# Patient Record
Sex: Female | Born: 1980
Health system: Southern US, Community
[De-identification: ages and names within clinical notes are randomized; demographics above are authoritative.]

## PROBLEM LIST (undated history)

## (undated) DIAGNOSIS — F329 Major depressive disorder, single episode, unspecified: Secondary | ICD-10-CM

## (undated) DIAGNOSIS — N979 Female infertility, unspecified: Secondary | ICD-10-CM

## (undated) DIAGNOSIS — F32A Depression, unspecified: Secondary | ICD-10-CM

## (undated) DIAGNOSIS — Z8619 Personal history of other infectious and parasitic diseases: Secondary | ICD-10-CM

## (undated) DIAGNOSIS — B009 Herpesviral infection, unspecified: Secondary | ICD-10-CM

## (undated) DIAGNOSIS — K219 Gastro-esophageal reflux disease without esophagitis: Secondary | ICD-10-CM

## (undated) DIAGNOSIS — IMO0001 Reserved for inherently not codable concepts without codable children: Secondary | ICD-10-CM

## (undated) HISTORY — DX: Female infertility, unspecified: N97.9

## (undated) HISTORY — DX: Major depressive disorder, single episode, unspecified: F32.9

## (undated) HISTORY — DX: Personal history of other infectious and parasitic diseases: Z86.19

## (undated) HISTORY — DX: Gastro-esophageal reflux disease without esophagitis: K21.9

## (undated) HISTORY — DX: Herpesviral infection, unspecified: B00.9

## (undated) HISTORY — DX: Depression, unspecified: F32.A

## (undated) HISTORY — DX: Reserved for inherently not codable concepts without codable children: IMO0001

---

## 2005-08-15 HISTORY — PX: CHOLECYSTECTOMY: SHX55

## 2010-05-21 ENCOUNTER — Ambulatory Visit: Payer: Self-pay | Admitting: Physician Assistant

## 2010-05-22 ENCOUNTER — Encounter: Payer: Self-pay | Admitting: Physician Assistant

## 2010-05-22 LAB — CONVERTED CEMR LAB
Clue Cells Wet Prep HPF POC: NONE SEEN
Trich, Wet Prep: NONE SEEN
Yeast Wet Prep HPF POC: NONE SEEN

## 2010-05-24 ENCOUNTER — Ambulatory Visit: Payer: Self-pay | Admitting: Obstetrics & Gynecology

## 2010-07-05 ENCOUNTER — Encounter: Admission: RE | Admit: 2010-07-05 | Discharge: 2010-07-05 | Payer: Self-pay | Admitting: Obstetrics & Gynecology

## 2010-07-07 ENCOUNTER — Ambulatory Visit: Payer: Self-pay | Admitting: Obstetrics & Gynecology

## 2010-08-15 HISTORY — PX: LAPAROSCOPY: SHX197

## 2010-08-18 ENCOUNTER — Encounter
Admission: RE | Admit: 2010-08-18 | Discharge: 2010-08-18 | Payer: Self-pay | Source: Home / Self Care | Attending: Obstetrics & Gynecology | Admitting: Obstetrics & Gynecology

## 2010-08-18 ENCOUNTER — Encounter: Payer: Self-pay | Admitting: Obstetrics & Gynecology

## 2010-08-18 ENCOUNTER — Ambulatory Visit
Admission: RE | Admit: 2010-08-18 | Discharge: 2010-08-18 | Payer: Self-pay | Source: Home / Self Care | Attending: Obstetrics & Gynecology | Admitting: Obstetrics & Gynecology

## 2010-08-18 LAB — CONVERTED CEMR LAB
Clue Cells Wet Prep HPF POC: NONE SEEN
Trich, Wet Prep: NONE SEEN
Yeast Wet Prep HPF POC: NONE SEEN

## 2010-08-21 ENCOUNTER — Encounter
Admission: RE | Admit: 2010-08-21 | Discharge: 2010-08-21 | Payer: Self-pay | Source: Home / Self Care | Attending: Obstetrics & Gynecology | Admitting: Obstetrics & Gynecology

## 2010-08-24 ENCOUNTER — Ambulatory Visit
Admission: RE | Admit: 2010-08-24 | Discharge: 2010-08-24 | Payer: Self-pay | Source: Home / Self Care | Attending: Obstetrics & Gynecology | Admitting: Obstetrics & Gynecology

## 2010-08-30 LAB — CBC
HCT: 39.5 % (ref 36.0–46.0)
Hemoglobin: 13.5 g/dL (ref 12.0–15.0)
MCH: 31.5 pg (ref 26.0–34.0)
MCHC: 34.2 g/dL (ref 30.0–36.0)
MCV: 92.3 fL (ref 78.0–100.0)
Platelets: 240 10*3/uL (ref 150–400)
RBC: 4.28 MIL/uL (ref 3.87–5.11)
RDW: 12.1 % (ref 11.5–15.5)
WBC: 6.7 10*3/uL (ref 4.0–10.5)

## 2010-08-30 LAB — SURGICAL PCR SCREEN
MRSA, PCR: NEGATIVE
Staphylococcus aureus: POSITIVE — AB

## 2010-09-06 ENCOUNTER — Ambulatory Visit (HOSPITAL_COMMUNITY)
Admission: RE | Admit: 2010-09-06 | Discharge: 2010-09-06 | Payer: Self-pay | Source: Home / Self Care | Attending: Obstetrics & Gynecology | Admitting: Obstetrics & Gynecology

## 2010-09-07 LAB — PREGNANCY, URINE: Preg Test, Ur: NEGATIVE

## 2010-09-08 ENCOUNTER — Ambulatory Visit: Admit: 2010-09-08 | Payer: Self-pay | Admitting: Obstetrics & Gynecology

## 2010-09-09 NOTE — Op Note (Addendum)
Maria [name], POLAND           ACCOUNT NO.:  [id number]  MEDICAL RECORD NO.:  [id number]          PATIENT TYPE:  AMB  LOCATION:  SDC                           FACILITY:  [facility]  PHYSICIAN:  Allie Bossier, MD        DATE OF BIRTH:  1981/03/14  DATE OF PROCEDURE:  09/06/2010 DATE OF DISCHARGE:  09/06/2010                              OPERATIVE REPORT   PREOPERATIVE DIAGNOSIS:  Right ovarian cyst.  POSTOPERATIVE DIAGNOSES:  Right ovarian cyst, endometriosis.  SURGEON:  Allie Bossier, MD  ASSISTANT:  Lesly Dukes, MD  ANESTHESIA:  Quillian Quince, MD, GETA.  COMPLICATIONS:  None.  ESTIMATED BLOOD LOSS:  Minimal.  SPECIMEN:  Excision of endometriosis implant from the cul-de-sac and right ovarian cyst.  DETAILED PROCEDURE AND FINDINGS:  The risks, benefits and alternatives of surgery were explained, understood and accepted.  Consents were signed.  She was taken to the operating room and general anesthesia was applied without complication.  In the dorsal lithotomy position, her vagina and abdomen were prepped and draped in the usual sterile fashion. A bimanual exam had been done which showed mobile adnexa bilaterally and an enlarged adnexa on her right.  Hulka manipulator was placed.  Gloves were changed.  Attention was turned to the abdomen.  A vertical umbilical incision was made.  A Veress needle was placed intraperitoneally.  I was unable to create a pneumoperitoneum due to abdominal pressure being too elevated; therefore, I discontinued the varies and decided to do an open laparoscopy.  The fascia was opened with Mayo scissors and tagged and held with 0 Vicryl sutures on each side.  A Hasson 10-mm trocar was placed.  Laparoscopy confirmed correct placement.  Pneumoperitoneum was established.  Peritoneum was established. Pelvis was inspected.  A 5-mm port was placed in the lower left quadrant and a 10-mm port in the lower right quadrant under direct laparoscopic  visualization, taking care to avoid the inferior epigastric vessel.  Inspection of the pelvis showed a normal upper abdomen.  The right ovary had multilobulated cyst.  There did not appear to be a hint of a endometrioma.  The posterior cul-de-sac showed some small amount of free fluid and an endometriosis lesion in the left side of the posterior cul-de-sac.  The left adnexa appeared normal.  Both oviducts appeared normal.  The anterior cul-de-sac appeared normal as did the uterus. Initially, we used a needle and hydrodissection to hydrodissect the ovarian capsule from the underlying ovarian cyst and an incision was made with cautery scissors and a opening was made large enough to remove the ovarian cyst.  By pulling the edges of the cut uterine capsule, the cyst was exposed and the large cyst was removed.  There was a several small grip size cyst that were not amendable to this treatment on the tail end of the ovary and using a gyrus, we removed this portion of the ovary.  Excellent hemostasis was noted.  The remainder of the normal- appearing ovary was wrapped with Interceed to help prevent future adhesions.  At this point, we elevated the endometriosis lesion in the cul-de-sac and excised it.  No bleeding was noted from the site.  The ports were removed.  The specimen was removed with the Endopouch and the fascia at both 10-mm sites were closed with 0 Vicryl figure-of-eight sutures.  A total of 10 mL of 0.5% Marcaine was then divided among the 3 incisions for postop pain relief.  Subcuticular closure was done at all sites with 4-0 Vicryl.  Instruments, sponge and needle counts were correct.  Please note that preoperatively, her bladder was drained with a straight cath.  She tolerated the procedure well.  She was extubated and taken to recovery room in stable condition with instruments, sponge and needle counts correct.     Allie Bossier, MD     MCD/MEDQ  D:  09/06/2010  T:   09/07/2010  Job:  [id number]  Electronically Signed by Nicholaus Bloom MD on 09/09/2010 03:50:09 PM

## 2010-09-15 ENCOUNTER — Encounter: Payer: Self-pay | Admitting: Obstetrics & Gynecology

## 2010-09-15 ENCOUNTER — Ambulatory Visit: Payer: BC Managed Care – PPO | Admitting: Obstetrics & Gynecology

## 2010-09-15 DIAGNOSIS — Z09 Encounter for follow-up examination after completed treatment for conditions other than malignant neoplasm: Secondary | ICD-10-CM

## 2010-10-19 ENCOUNTER — Encounter: Payer: BC Managed Care – PPO | Admitting: Obstetrics & Gynecology

## 2010-10-19 DIAGNOSIS — Z09 Encounter for follow-up examination after completed treatment for conditions other than malignant neoplasm: Secondary | ICD-10-CM

## 2010-11-05 ENCOUNTER — Ambulatory Visit: Payer: BC Managed Care – PPO

## 2010-11-05 NOTE — Assessment & Plan Note (Signed)
Maria [name]           ACCOUNT NO.:  [id number]  MEDICAL RECORD NO.:  [id number]           PATIENT TYPE:  LOCATION:  [facility] [place]           FACILITY:  PHYSICIAN:  Allie Bossier, MD        DATE OF BIRTH:  06-25-1981  DATE OF SERVICE:  09/15/2010                                 CLINIC NOTE  Ms. Maria [name] is a 30 year old single white G0, who had a laparoscopic right ovarian cystectomy (pathology shows benign ovarian serous cyst) and a biopsy of her cul-de-sac that showed endometriosis implant.  She had this done on September 06, 2010.  She is a week out.  She says that she went to work on Monday, but had to leave due to right-sided pain. She sits at her job and she says the pain is much worse when she is sitting at 90 degrees.  She also says that when she is standing and lying down, the pain is not as bad.  She still has Vicodin left.  She is taking about 1 Motrin a day.  Her other complaint is that of constipation.  She says she is having bowel movements but "not a weeks' worth."  She has been using MiraLax 17 g at night for the last 3 days and she tried 1 Correctol last night.  PHYSICAL EXAMINATION:  Her incisions are all healing very well. Bimanual exam reveals no masses or particular tenderness on exam of any of her sites.  ASSESSMENT AND PLAN:  I believe her postop discomfort is due to inadequate ibuprofen use as well as constipation.  I have recommended she take her ibuprofen 3 times a day whether she feels like she necessarily needs or not.  I have also recommended she double her dose and take MiraLax morning and evening and take 2 Correctol tonight.  I will see her in 4 weeks or sooner as necessary.     Allie Bossier, MD    MCD/MEDQ  D:  09/15/2010  T:  09/16/2010  Job:  [id number]

## 2010-11-08 ENCOUNTER — Ambulatory Visit: Payer: BC Managed Care – PPO

## 2010-11-09 ENCOUNTER — Ambulatory Visit (INDEPENDENT_AMBULATORY_CARE_PROVIDER_SITE_OTHER): Payer: BC Managed Care – PPO | Admitting: Obstetrics & Gynecology

## 2010-11-09 DIAGNOSIS — Z113 Encounter for screening for infections with a predominantly sexual mode of transmission: Secondary | ICD-10-CM

## 2010-11-09 DIAGNOSIS — N898 Other specified noninflammatory disorders of vagina: Secondary | ICD-10-CM

## 2010-11-10 ENCOUNTER — Encounter: Payer: Self-pay | Admitting: Obstetrics & Gynecology

## 2010-11-10 LAB — CONVERTED CEMR LAB
Chlamydia, DNA Probe: NEGATIVE
Clue Cells Wet Prep HPF POC: NONE SEEN
GC Probe Amp, Genital: NEGATIVE
HCV Ab: NEGATIVE
HIV: NONREACTIVE
Hepatitis B Surface Ag: NEGATIVE
Trich, Wet Prep: NONE SEEN
Yeast Wet Prep HPF POC: NONE SEEN

## 2010-11-17 ENCOUNTER — Ambulatory Visit: Payer: Self-pay | Admitting: Obstetrics & Gynecology

## 2010-12-28 NOTE — Assessment & Plan Note (Signed)
NAMERUBERTA, HOLCK           ACCOUNT NO.:  [id number]   MEDICAL RECORD NO.:  [id number]          PATIENT TYPE:  POB   LOCATION:  [facility] [place]         FACILITY:  [facility]   PHYSICIAN:  Elsie Lincoln, MD      DATE OF BIRTH:  May 16, 1981   DATE OF SERVICE:  07/07/2010                                  CLINIC NOTE   HISTORY:  The patient is a 30 year old female who presents for IUD  removal, to discuss ultrasound results, and start on OCPs.  The patient  is a para 1-0-1-0, who has Mirena after the past 4 years, but has right  lower quadrant pain.  She had a transvaginal ultrasound which revealed  multiple follicles abiding each either.  We did inform ultrasound here  and it appears that we had same results.  Sometime the Mirena can cause  persistent follicular cyst to occur.  I suggested that she had the IUD  removed and start on OCPs, this would also suppress ovulation and may  help this development of ovarian cyst.  The patient does agrees to this.  The patient knows that she needs to use backup contraception for the  first week after starting OCPs.   PHYSICAL EXAMINATION:  VITAL SIGNS:  Pulse 79, blood pressure 131/79,  weight 134, and height 62 inches.  GENERAL:  Well nourished, well developed, no apparent stress.  ABDOMEN:  Soft, nontender.  No rebound.  CHEST:  Normal movement.  GENITALIA:  Tanner V, shaved.  Vagina is pink and normal rugae.  Cervix  is closed, strings seen.   PROCEDURE:  Informed consent was obtained, intrauterine device was  removed without incident, and no bleeding.   ASSESSMENT AND PLAN:  A 30 year old female with persistent follicular  cyst on the right ovary.  1. Intrauterine device removal.  2. Start of oral contraceptive, Loestrin Fe 24.  3. Follow up transvaginal ultrasound in 8 weeks.  4. Return to clinic in 9 weeks.           ______________________________  Elsie Lincoln, MD     KL/MEDQ  D:  07/07/2010  T:  07/07/2010  Job:   [id number]

## 2010-12-28 NOTE — Assessment & Plan Note (Signed)
NAMEYARELLI, DECELLES           ACCOUNT NO.:  [id number]   MEDICAL RECORD NO.:  [id number]          PATIENT TYPE:  POB   LOCATION:  [facility] [place]         FACILITY:  [facility]   PHYSICIAN:  Georges Mouse, CNM   DATE OF BIRTH:  10/17/1980   DATE OF SERVICE:                                  CLINIC NOTE   REASON FOR TODAY'S VISIT:  She is here for her annual exam and also to  follow up on pelvic pain for which she was evaluated in the clinic on  May 21, 2010.   The patient is a G1, P0-0-1-0 who is here for annual exam.  She also has  a complaint of pelvic pain which is right-sided and hurts with  intercourse, it generally only hurts in certain positions.  She does  sometimes have pain outside of when she is having intercourse.  She is  not having any vaginal bleeding.  She is using a Mirena for  contraception and does not have any complaints with it.  She does not  have periods while she is using the Mirena.  She was seen here on  May 21, 2010, for evaluation for a vaginal infection.  She was told  at that time to use RepHresh vaginal gel referral and also probiotics.  She was on the probiotics throughout this year, she is using the  RepHresh, she has used it once since that visit.  She has no other  complaints today.  Her last Pap smear was in 2010, her records has been  requested from her prior Moana [facility].   DRUG ALLERGIES:  NKDA.   MEDICATIONS:  Mirena IUD and Ambien p.r.n. for sleep.   MENSTRUAL HISTORY:  She is currently not having periods due to her  Mirena.   OBSTETRICAL HISTORY:  She is a G1, P0-0-1-0 with history of 1  miscarriage 6 years ago.   GYNECOLOGIC HISTORY:  She has a history of Chlamydia and genital HSV.   PERSONAL MEDICAL HISTORY:  Positive for stomach ulcers and reflux.  Otherwise negative.   SURGICAL HISTORY:  Positive for gallbladder removal in 2007.   SOCIAL HISTORY:  She is employed by an [employer].  She lives  with her boyfriend.   She is not a smoker.  She drinks occasionally on  the weekends.  She does not use any drugs per her report.  She denies  any occurrences of abuse.  She had one sexual partner in the last year.   FAMILY HISTORY:  Positive for heart disease and heart attack on her  mother's side and high blood pressure on her mother's side.  She denies  any breast, colon, ovarian, or uterine cancer history in her family.   REVIEW OF SYSTEMS:  Negative other than what was listed in the HPI.   PHYSICAL EXAMINATION:  GENERAL:  A well-appearing Caucasian female in no  acute distress.  VITAL SIGNS:  Pulse 85, blood pressure 103/69, weight 133, height 62  inches.  BREASTS:  Are bilaterally soft and nontender, symmetrical in shape and  size.  Breasts bilaterally have a gravelly appearance with no discrete  masses.  PELVIC:  She has a shaved perineum.  External genitalia without lesions.  There is a small amount of white discharge noted on the vaginal walls  and on the cervix.  Her IUD strings are visible protruding about 2 cm  from the cervical os.  The cervix is nonfriable.  Pap smear was  collected from annual exam.  She has nonenlarged, nontender uterus.  Her  adnexa are nontender.  No masses are palpable as well.  Pelvic  ultrasound today showed 4 small simple cyst in her right ovary ranging  from 2.9 cm to 1.5 cm.   ASSESSMENT:  60. A 30 year old G1, P0-0-1-0.  2. Well-woman exam.  3. Contraception with Mirena.  4. Ovarian cyst.   PLAN:  The patient and I discussed options for dealing with the ovarian  cysts including oral contraceptives, discontinuing her Mirena, or  expectant management with diclofenac for pain and then returning in 6  weeks to reevaluate this by ultrasound and having a followup visit in  the office.  At this time, she desires to have diclofenac for pain and  to follow up in 6 weeks.  If she continues to have pain or if her pain  worsens, she may give [place] a call before that time,  otherwise at her 6-  week followup visit, we will evaluate if the cyst are worsening or  resolving after she has time to think about it, she may decide to have  her Mirena removed at that visit but she was unsure if she wanted to do  that at this time.  We did discuss that this can be side effect of  Mirena but at this time not any concerned and she can continue the  Mirena if she likes using it as birth control.  If she does not continue  to have pain and she feels like her problems resolved, she can cancel  her followup visit if she desires, in that case, she will follow up here  for her annual exam.           ______________________________  Georges Mouse, CNM     NF/MEDQ  D:  05/24/2010  T:  05/25/2010  Job:  [id number]

## 2010-12-28 NOTE — Assessment & Plan Note (Signed)
Maria [name], Maria [name]           ACCOUNT NO.:  [id number]   MEDICAL RECORD NO.:  [id number]          PATIENT TYPE:  POB   LOCATION:  [facility] [place]         FACILITY:  [facility]   PHYSICIAN:  Maria [name], CNM    DATE OF BIRTH:  Feb 08, 1981   DATE OF SERVICE:  05/21/2010                                  CLINIC NOTE   The patient is being seen at the [facility].   REASON FOR TODAY'S VISIT:  Evaluation of vaginal infection.   HISTORY OF PRESENT ILLNESS:  The patient states that she has had a  vaginal discharge with a slight odor and thought she had a bacterial  infection.  She used MetroGel 2 nights ago and now believes that she  might have a yeast infection.  She states a longstanding history of  chronic bacterial vaginosis that occurs after intercourse that her  previous Leonard Feigel had been treating with MetroGel.  She states that she  also has complaints of some pelvic pain with intercourse in certain  positions.  She states a history of some type of infection in her  fallopian tube that was treated with antibiotics and the pain went away,  but is now coming back slightly at this time.  The patient is a new  patient to our practice.   She has no known drug allergies.  Her current medications include a  Mirena IUD and Ambien p.r.n. sleep.  Health care maintenance, her last  Pap smear was in 2010.  Her last general exam was in March 2011.   MENSTRUAL HISTORY:  She currently is amenorrheic due to her Mirena IUD.  Contraceptive history, Mirena IUD.   OBSTETRICAL HISTORY:  She is gravida 1 para 0-0-1-0, history of 1  miscarriage 6 years ago.   GYNECOLOGIC HISTORY:  She does have a history of Chlamydia and herpes.   PERSONAL MEDICAL HISTORY:  Positive for stomach ulcers and reflux.   SURGICAL HISTORY:  Positive for gallbladder removal in 2007.   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  She lives with her  boyfriend.  She does not smoke.  She currently is a  social drinker on the weekends.  She denies the use of illicit drugs  including crack, cocaine, marijuana, heroin.  She denies sexual or  physical abuse.  She has had 1 sexual partner in the last year.   FAMILY HISTORY:  Positive for heart disease and heart attack in her  mother's family and high blood pressure in her mother's family.  She  denies breast, colon, ovarian or uterine cancer.   REVIEW OF SYSTEMS:  Systemic review is negative other than what has  already been reviewed in the HPI.   PHYSICAL EXAMINATION:  GENERAL:  Maria [name] is a pleasant 30 year old  Caucasian female.  VITAL SIGNS:  Stable.  Her pulse is 80, her blood pressure is 116/74,  her weight is 133, and her height is 62 inches.  HEENT:  Grossly normal.  Mucous membranes are pink without lesions.  PELVIC:  Genitalia, she has a shaved perineum.  External genitalia  without lesions.  There is a moderate amount of white adherent  discharge  noted on the vaginal wall and in the vault.  IUD strings are visible  approximately 2 cm below the external os.  Cervix is nonfriable.  Bimanual exam reveals a nonenlarged, nontender uterus.  There is no  cervical motion tenderness.  Adnexa are not enlarged and nontender  bilaterally.   ASSESSMENT:  Probable Candida infection.   PLAN:  1. We will send wet prep today.  2. Prescription given for Diflucan 150 mg.  3. Recommend regimen for a RepHresh gel, pH gel for 1 week as directed      on packaging along with daily probiotic use, either RepHresh brand,      Pro-B or Restora.  Prescription and samples for Restora are given      today to be used for approximately 3-6 months daily.   FOLLOWUP:  The patient should return on Monday for a pelvic ultrasound  to rule out further evaluation of pelvic pain during intercourse,  evaluation of ovarian cyst.           ______________________________  Maria [name], CNM     SS/MEDQ   D:  05/21/2010  T:  05/22/2010  Job:  [id number]

## 2010-12-28 NOTE — Assessment & Plan Note (Signed)
[name], [name]           ACCOUNT NO.:  [id number]   MEDICAL RECORD NO.:  [id number]          PATIENT TYPE:  POB   LOCATION:  [facility] [place]         FACILITY:  [facility]   PHYSICIAN:  Allie Bossier, MD        DATE OF BIRTH:  07/13/1981   DATE OF SERVICE:  08/24/2010                                  CLINIC NOTE   HISTORY OF PRESENT ILLNESS:  Maria [name] is a 30 year old single white  gravida 0 who has been seen at the [facility] office since May 20, 2010.  She has been complaining of pelvic pain and dyspareunia since  October 2011.  She has had several ultrasounds to evaluate this issue.  The first ultrasound here was done on July 05, 2010, at that time  there was nothing particularly abnormal seen.  Please note, she had a  Mirena IUD at that time.  She continued to have pain with and without  sex and had another ultrasound done on August 18, 2010.  At that time,  her right ovary measured a total of 6.1 x 3.9 x 3.6 cm and a complex  lesion involving the left ovary measured 5.9 x 2.5 x 3.4 cm.  They were  noted to be multiple internal septations some of which are thickened and  have nodular flow.  The left ovary appeared normal.  There was some  trace fluid in the right adnexa.  Please note that her IUD had been  removed, and she was placed on Loestrin birth control pills.  An MRI was  done 3 days after the ultrasound, and it confirmed the finding of a  right ovarian cyst.  There was not anything particularly else seen.  She  says that she has not been able to have sex for several months because  of the pain and she is being affected by this pain many other times  during her course of her daily life.  She says the diclofenac which she  was given did not help.  She has since discontinued the Loestrin because  made her cry.  She is not interested in resuming any birth control  pills or hormones at this time.   PAST MEDICAL HISTORY:  She had recurrent BV in the past, and she  also  has a history of Chlamydia and genital HSV.   SURGICAL HISTORY:  She had a cholecystectomy in 2007 laparoscopically  and a wisdom tooth extraction.   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  She lives with her boyfriend.  She denies  tobacco use or illegal drug use.  She drinks rarely on the weekends.   FAMILY HISTORY:  Negative for breast, colon, or GYN cancers.  Although  her father has colon cancer and there is also heart disease and  hypertension on her mom's side.  Her mother has also kidney failure.  Her mother had a pulmonary embolus but nobody else has had any bleeding  disorders, bleeding, or clotting disorders.  The patient herself has not  had any bleeding or clotting disorders and in fact been on birth control  pills.   MEDICATIONS:  She uses Ambien  for sleep on a p.r.n. basis and diclofenac  p.r.n., Valtrex 500 mg daily, and she recently finished taking Provera.   ALLERGIES:  No known drug allergies.  No latex allergies.   PHYSICAL EXAMINATION:  GENERAL:  Well-nourished, well-hydrated very  pleasant white female.  VITAL SIGNS:  Height 5 feet 2 inches, weight 135 pounds, blood pressure  108/65, pulse 79.  HEART:  Regular rate and rhythm.  LUNGS:  Clear to auscultation bilaterally.  ABDOMEN:  Benign.  PELVIC:  Deferred at this visit because of her pain and the fact that I  cannot see the __________ offer any further information.   ASSESSMENT/PLAN:  Symptomatic right ovarian cyst and right pelvic pain  prior to the onset of the cyst.  I have discussed watchful waiting with  Maria [name] versus removal of the cyst.  Please note that the MRI showed a  normal right ovary is not visualized separate from the mass. I  therefore have counseled her that an entire right oophorectomy may be  done if I cannot find any normal ovarian tissue on that side.  She  understands the risks of surgery including but not limited to  infection,  bleeding, damage to bowel, bladder, ureters as well as blood clots and  legs or lungs.  She wishes to proceed, and surgery will be scheduled as  soon as possible.      Allie Bossier, MD     MCD/MEDQ  D:  08/24/2010  T:  08/25/2010  Job:  [id number]

## 2011-02-24 NOTE — Assessment & Plan Note (Signed)
NAMEMAILY, DEBARGE           ACCOUNT NO.:  [id number]  MEDICAL RECORD NO.:  [id number]          PATIENT TYPE:  POB  LOCATION:  [facility] [place]         FACILITY:  [facility]  PHYSICIAN:  Allie Bossier, MD        DATE OF BIRTH:  [birth year]  DATE OF SERVICE:  10/19/2010                                 CLINIC NOTE  IDENTIFICATION:  Ms. Marval Regal is a 30 year old single white G0 who underwent a laparoscopic right ovarian cystectomy and biopsy of her cul- de-sac on September 06, 2010.  She had a rather slow recovery initially, but now she reports that she has returned to all her normal function without any pain.  She has returned to the gym and has no complaints. Postoperatively, I started her on Sprintec.  She has had no significant issues with the Sprintec at all.  Please note that Loestrin caused her to "cry all the time."  She had mentioned to me that although she has lost weight since being on surgery and the Sprintec, she was worried because her friends have gained weight on Sprintec.  I suggested that as long as she is not having any problems of weight gain with the Sprintec that she continue Sprintec.  On exam, her incisions are very well healed.  ASSESSMENT AND PLAN:  Postop with endometriosis noted in a biopsy from her cul-de-sac.  I have recommended she take continuous Sprintec, and I will see her back in November for her annual or sooner as necessary.     Allie Bossier, MD    MCD/MEDQ  D:  10/19/2010  T:  10/20/2010  Job:  [id number]

## 2011-02-24 NOTE — Assessment & Plan Note (Signed)
Maria [name], Maria [name]           ACCOUNT NO.:  [id number]  MEDICAL RECORD NO.:  [id number]          PATIENT TYPE:  POB  LOCATION:  [facility] [place]         FACILITY:  [facility]  PHYSICIAN:  Elsie Lincoln, MD      DATE OF BIRTH:  January 16, 1981  DATE OF SERVICE:  11/09/2010                                 CLINIC NOTE  The patient is a 30 year old female, who presents complaining of vaginal discharge and also wants an STD screen.  Her recent boyfriend she has been around for 3 years.  She has a history of recurrent BV.  The patient is also complaining of questionable recurrent shingles outbreak at her sacrum.  It is not a vesicular form yet, but wants to pick and test it.  I encouraged her to go to her primary care doctor, but if she can get in, we could possibly test when it erupts to vesicles, although this is not in our area of expertise.  Again, I encouraged her to go to her primary care doctor.  PHYSICAL EXAMINATION:  VITAL SIGNS:  Blood pressure 128/81, weight 135, height 62 inches, pulse 76. GENERAL:  Well-nourished, well-developed in no apparent distress. HEENT:  Normocephalic, atraumatic.  Good dentition. CHEST:  Normal air movement. ABDOMEN:  Soft, nontender. BACK:  Just above the sacrum, there is beginning for bumps that the patient says will become weepy and erupt and very painful. GENITALIA:  Shaven, Tanner V.  No lesions.  Vagina pink.  Normal rugae. Small amount of white discharge.  Cervix closed.  Nontender.  ASSESSMENT AND PLAN:  A 30 year old female, rule out vaginitis and sexually transmitted diseases. 1. Wet prep. 2. GC and Chlamydia. 3. D&C, HIV, RPR. 4. The patient to follow up with primary care physician to rule out     shingles.          ______________________________ Elsie Lincoln, MD    KL/MEDQ  D:  11/09/2010  T:  11/10/2010  Job:  [id number]

## 2011-03-15 ENCOUNTER — Encounter: Payer: Self-pay | Admitting: *Deleted

## 2011-03-15 ENCOUNTER — Telehealth: Payer: Self-pay | Admitting: *Deleted

## 2011-03-15 DIAGNOSIS — B9689 Other specified bacterial agents as the cause of diseases classified elsewhere: Secondary | ICD-10-CM

## 2011-03-15 DIAGNOSIS — N760 Acute vaginitis: Secondary | ICD-10-CM | POA: Insufficient documentation

## 2011-03-15 DIAGNOSIS — N809 Endometriosis, unspecified: Secondary | ICD-10-CM | POA: Insufficient documentation

## 2011-03-15 NOTE — Telephone Encounter (Signed)
That's fine. Can you send or call in to her pharmacy?

## 2011-03-15 NOTE — Telephone Encounter (Signed)
Pt called requesting to change from [facility] OCP to the [employer].  She feels like the OCP is making her gain weight.

## 2011-03-16 ENCOUNTER — Telehealth: Payer: Self-pay | Admitting: *Deleted

## 2011-03-16 DIAGNOSIS — Z3009 Encounter for other general counseling and advice on contraception: Secondary | ICD-10-CM

## 2011-03-16 MED ORDER — ETONOGESTREL-ETHINYL ESTRADIOL 0.12-0.015 MG/24HR VA RING: VAGINAL_RING | VAGINAL | Status: DC

## 2011-03-16 NOTE — Telephone Encounter (Signed)
Per Rosalita [place],CNM may call in RX for Nuvaring # 1 insert for 3 weeks then remove and replace every 4 weeks.  Pt is aware of this and will call to [facility] in [place].

## 2011-04-12 ENCOUNTER — Encounter: Payer: Self-pay | Admitting: Obstetrics & Gynecology

## 2011-04-12 ENCOUNTER — Encounter: Payer: Self-pay | Admitting: *Deleted

## 2011-04-12 ENCOUNTER — Ambulatory Visit (INDEPENDENT_AMBULATORY_CARE_PROVIDER_SITE_OTHER): Payer: BC Managed Care – PPO | Admitting: Obstetrics & Gynecology

## 2011-04-12 ENCOUNTER — Other Ambulatory Visit: Payer: Self-pay | Admitting: Obstetrics & Gynecology

## 2011-04-12 VITALS — BP 148/95 | HR 103 | Temp 98.5°F | Resp 17 | Ht 62.0 in | Wt 138.0 lb

## 2011-04-12 DIAGNOSIS — Z3009 Encounter for other general counseling and advice on contraception: Secondary | ICD-10-CM

## 2011-04-12 DIAGNOSIS — A499 Bacterial infection, unspecified: Secondary | ICD-10-CM

## 2011-04-12 DIAGNOSIS — B9689 Other specified bacterial agents as the cause of diseases classified elsewhere: Secondary | ICD-10-CM

## 2011-04-12 DIAGNOSIS — Z30430 Encounter for insertion of intrauterine contraceptive device: Secondary | ICD-10-CM

## 2011-04-12 DIAGNOSIS — N898 Other specified noninflammatory disorders of vagina: Secondary | ICD-10-CM

## 2011-04-12 DIAGNOSIS — N760 Acute vaginitis: Secondary | ICD-10-CM

## 2011-04-12 LAB — POCT URINE PREGNANCY: Preg Test, Ur: NEGATIVE

## 2011-04-12 MED ORDER — PARAGARD INTRAUTERINE COPPER IU IUD
INTRAUTERINE_SYSTEM | Freq: Once | INTRAUTERINE | Status: AC
  Administered 2011-04-12: 16:00:00 via INTRAUTERINE

## 2011-04-12 MED ORDER — METRONIDAZOLE 500 MG PO TABS: 500.0000 mg | ORAL_TABLET | Freq: Three times a day (TID) | ORAL | Status: AC

## 2011-04-12 NOTE — Progress Notes (Signed)
  Subjective:    Patient ID: Tayona Sarnowski, female    DOB: 1980/08/16, 30 y.o.   MRN: [id number]  HPI  80 hours ago Marlen had unprotected intercourse.  She comes in today wanting emergency contraception.  She also complains of a 3 day history of an abnormal discharge without itching.  Review of Systems    New partner for last 6 months Still working at [employer] Pap due 10/12 Objective:   Physical Exam  Vaginal discharge c/w BV Bimanual reveals NSSA, minimally mobile uterus and normal adnexa Pregnancy test was negative. Cervix was cleaned with Betadine, grasped with single tooth tenaculem, and uterus sounded to 8 cm.  Copper T IUD placed without difficulty. Strings cut to 3 cm. She tolerated procedure well.    Assessment & Plan:  Emergency birth control and LARC- Copper T placed She will return in October for her annual Cervical cultures checked today BV-I will treat her with flagyl and send wet prep for confirmation.

## 2011-04-13 LAB — WET PREP, GENITAL
Trich, Wet Prep: NONE SEEN
Yeast Wet Prep HPF POC: NONE SEEN

## 2011-04-13 LAB — GC/CHLAMYDIA PROBE AMP, URINE
Chlamydia, Swab/Urine, PCR: NEGATIVE
GC Probe Amp, Urine: NEGATIVE

## 2011-05-31 ENCOUNTER — Ambulatory Visit: Payer: BC Managed Care – PPO | Admitting: Obstetrics & Gynecology

## 2011-06-21 ENCOUNTER — Ambulatory Visit (INDEPENDENT_AMBULATORY_CARE_PROVIDER_SITE_OTHER): Payer: BC Managed Care – PPO | Admitting: Obstetrics & Gynecology

## 2011-06-21 ENCOUNTER — Encounter: Payer: Self-pay | Admitting: Obstetrics & Gynecology

## 2011-06-21 VITALS — BP 120/78 | HR 77 | Temp 98.5°F | Resp 16 | Ht 64.0 in | Wt 140.0 lb

## 2011-06-21 DIAGNOSIS — Z1272 Encounter for screening for malignant neoplasm of vagina: Secondary | ICD-10-CM

## 2011-06-21 DIAGNOSIS — N898 Other specified noninflammatory disorders of vagina: Secondary | ICD-10-CM

## 2011-06-21 DIAGNOSIS — Z01419 Encounter for gynecological examination (general) (routine) without abnormal findings: Secondary | ICD-10-CM

## 2011-06-21 DIAGNOSIS — Z113 Encounter for screening for infections with a predominantly sexual mode of transmission: Secondary | ICD-10-CM

## 2011-06-21 DIAGNOSIS — Z0000 Encounter for general adult medical examination without abnormal findings: Secondary | ICD-10-CM

## 2011-06-21 NOTE — Progress Notes (Signed)
Subjective:    Maria [name] is a 30 y.o. female who presents for an annual exam. She thinks she may have a "bacterial infection" due to a discharge.The patient is sexually active. GYN screening history: last pap: was normal. The patient wears seatbelts: yes. The patient participates in regular exercise: yes. Has the patient ever been transfused or tattooed?: yes (2 tattoos). The patient reports that there is not domestic violence in her life.   Menstrual History: OB History    Grav Para Term Preterm Abortions TAB SAB Ect Mult Living   1 0 0  1 0 1         Menarche age: 83 Patient's last menstrual period was 06/16/2011.    The following portions of the patient's history were reviewed and updated as appropriate: allergies, current medications, past family history, past medical history, past social history, past surgical history and problem list.  Review of Systems A comprehensive review of systems was negative.    Objective:    BP 120/78  Pulse 77  Temp(Src) 98.5 F (36.9 C) (Oral)  Resp 16  Ht 5\' 4"  (1.626 m)  Wt 63.504 kg (140 lb)  BMI 24.03 kg/m2  LMP 06/16/2011  General Appearance:    Alert, cooperative, no distress, appears stated age  Head:    Normocephalic, without obvious abnormality, atraumatic  Eyes:    PERRL, conjunctiva/corneas clear, EOM's intact, fundi    benign, both eyes  Ears:    Normal TM's and external ear canals, both ears  Nose:   Nares normal, septum midline, mucosa normal, no drainage    or sinus tenderness  Throat:   Lips, mucosa, and tongue normal; teeth and gums normal  Neck:   Supple, symmetrical, trachea midline, no adenopathy;    thyroid:  no enlargement/tenderness/nodules; no carotid   bruit or JVD  Back:     Symmetric, no curvature, ROM normal, no CVA tenderness  Lungs:     Clear to auscultation bilaterally, respirations unlabored  Chest Wall:    No tenderness or deformity   Heart:    Regular rate and rhythm, S1 and S2 normal, no murmur,  rub   or gallop  Breast Exam:    No tenderness, masses, or nipple abnormality  Abdomen:     Soft, non-tender, bowel sounds active all four quadrants,    no masses, no organomegaly  Genitalia:    Normal female without lesion, discharge or tenderness, NSSA, NT, no masses, only a small amount of blood seen in the vault     Extremities:   Extremities normal, atraumatic, no cyanosis or edema  Pulses:   2+ and symmetric all extremities  Skin:   Skin color, texture, turgor normal, no rashes or lesions  Lymph nodes:   Cervical, supraclavicular, and axillary nodes normal  Neurologic:   CNII-XII intact, normal strength, sensation and reflexes    throughout  .    Assessment:    Healthy female exam.    Plan:     Pap smear.  Wet prep sent

## 2011-06-22 LAB — WET PREP, GENITAL
Clue Cells Wet Prep HPF POC: NONE SEEN
Trich, Wet Prep: NONE SEEN
Yeast Wet Prep HPF POC: NONE SEEN

## 2011-08-22 ENCOUNTER — Encounter: Payer: Self-pay | Admitting: Obstetrics and Gynecology

## 2011-08-22 ENCOUNTER — Ambulatory Visit (INDEPENDENT_AMBULATORY_CARE_PROVIDER_SITE_OTHER): Payer: BC Managed Care – PPO | Admitting: Obstetrics and Gynecology

## 2011-08-22 VITALS — BP 119/79 | HR 67 | Temp 98.4°F | Resp 16 | Ht 63.0 in | Wt 140.0 lb

## 2011-08-22 DIAGNOSIS — N898 Other specified noninflammatory disorders of vagina: Secondary | ICD-10-CM

## 2011-08-22 LAB — WET PREP FOR TRICH, YEAST, CLUE
Trich, Wet Prep: NONE SEEN
Yeast Wet Prep HPF POC: NONE SEEN

## 2011-08-22 NOTE — Progress Notes (Signed)
Maria [name] y.o.G1P0010 @Unknown  Chief Complaint  Patient presents with  . Gynecologic Exam    vaginal discharge with odor    SUBJECTIVE  HPI:  Patient reports a persisting abnormal vaginal discharge which temporarily responds to treatment with Flagyl but occurs each time she has intercourse which is on a regular basis. Denies dyspareunia. She describes teh discharge as thin and mucusy, pruritic and having a sour odor. She does not douche. Does not use condoms, lubricants or other intravaginal substances. Tried taking probiotis without symptomatic improvement. Is satisfied with her ParaGard IUD.   Past Medical History  Diagnosis Date  . Reflux   . History of chlamydia   . Herpes     Takes Valtrex daily  . Endometriosis 1/12    Laps   Past Surgical History  Procedure Date  . Cholecystectomy 2007  . Laparoscopy 1/12    Rt ov cyst and endometriosis   History   Social History  . Marital Status: Single    Spouse Name: N/A    Number of Children: N/A  . Years of Education: N/A   Occupational History  . Not on file.   Social History Main Topics  . Smoking status: Never Smoker   . Smokeless tobacco: Never Used  . Alcohol Use: 0.5 oz/week    1 drink(s) per week  . Drug Use: No  . Sexually Active: Yes    Birth Control/ Protection: Other-see comments     nuvaring   Other Topics Concern  . Not on file   Social History Narrative  . No narrative on file   Current Outpatient Prescriptions on File Prior to Visit  Medication Sig Dispense Refill  . IUD's (PARAGARD INTRAUTERINE COPPER) IUD IUD 1 each by Intrauterine route once.        . desogestrel-ethinyl estradiol (APRI,EMOQUETTE,SOLIA) 0.15-30 MG-MCG tablet Take 1 tablet by mouth daily.        . diclofenac (CATAFLAM) 50 MG tablet Take 50 mg by mouth 3 (three) times daily.        Marland Kitchen etonogestrel-ethinyl estradiol (NUVARING) 0.12-0.015 MG/24HR vaginal ring Insert vaginally and leave in place for 3 consecutive weeks,  then remove for 1 week.  1 each  12  . valACYclovir (VALTREX) 500 MG tablet Take 500 mg by mouth daily.        Marland Kitchen ZIRGAN 0.15 % GEL       . zolpidem (AMBIEN) 10 MG tablet Take 10 mg by mouth at bedtime as needed.         No Known Allergies  ROS: Pertinent items in HPI  OBJECTIVE  BP 119/79  Pulse 67  Temp(Src) 98.4 F (36.9 C) (Oral)  Resp 16  Ht 5\' 3"  (1.6 m)  Wt 140 lb (63.504 kg)  BMI 24.80 kg/m2  LMP 09/08/2010  Physical Exam  Constitutional: She is well-developed, well-nourished, and in no distress.  HENT:  Head: Normocephalic.  Neck: Neck supple.  Abdominal: Soft. There is no tenderness.  Genitourinary: Uterus normal, cervix normal, right adnexa normal and left adnexa normal. Vaginal discharge found.       No mucosal inflammation. Vaginal discharge appears physiologic and is scant and mucousy.    ASSESSMENT Irritative vaginal discharge    PLAN Wet prep sent. Will treat based on the results. Discussed the option of trying to use condoms to determine if semen is the source of her irritative discharge. Also discussed seeing Acigel ora normalized pH vaginal lubricant.

## 2011-08-23 ENCOUNTER — Telehealth: Payer: Self-pay | Admitting: *Deleted

## 2011-08-23 MED ORDER — METRONIDAZOLE 500 MG PO TABS: 500.0000 mg | ORAL_TABLET | Freq: Two times a day (BID) | ORAL | Status: AC

## 2011-08-23 NOTE — Telephone Encounter (Signed)
Pt notified of positive results of BV on wet prep.  RX for Flagyl 500mg  #14 to be taken BID x 7 days sent to [facility] in [place].

## 2011-10-31 ENCOUNTER — Ambulatory Visit (INDEPENDENT_AMBULATORY_CARE_PROVIDER_SITE_OTHER): Payer: BC Managed Care – PPO | Admitting: Physician Assistant

## 2011-10-31 ENCOUNTER — Encounter: Payer: Self-pay | Admitting: Physician Assistant

## 2011-10-31 VITALS — BP 130/87 | HR 93 | Temp 98.5°F | Resp 16 | Ht 65.0 in | Wt 131.0 lb

## 2011-10-31 DIAGNOSIS — B3731 Acute candidiasis of vulva and vagina: Secondary | ICD-10-CM

## 2011-10-31 DIAGNOSIS — B373 Candidiasis of vulva and vagina: Secondary | ICD-10-CM

## 2011-10-31 MED ORDER — FLUCONAZOLE 150 MG PO TABS: 300.0000 mg | ORAL_TABLET | Freq: Once | ORAL | Status: AC

## 2011-10-31 MED ORDER — REPHRESH PRO-B PO CAPS: 1.0000 | ORAL_CAPSULE | Freq: Two times a day (BID) | ORAL | Status: DC

## 2011-10-31 NOTE — Patient Instructions (Signed)
Candida Infection, Adult A candida infection (also called yeast, fungus and Monilia infection) is an overgrowth of yeast that can occur anywhere on the body. A yeast infection commonly occurs in warm, moist body areas. Usually, the infection remains localized but can spread to become a systemic infection. A yeast infection may be a sign of a more severe disease such as diabetes, leukemia, or AIDS. A yeast infection can occur in both men and women. In women, Candida vaginitis is a vaginal infection. It is one of the most common causes of vaginitis. Men usually do not have symptoms or know they have an infection until other problems develop. Men may find out they have a yeast infection because their sex partner has a yeast infection. Uncircumcised men are more likely to get a yeast infection than circumcised men. This is because the uncircumcised glans is not exposed to air and does not remain as dry as that of a circumcised glans. Older adults may develop yeast infections around dentures. CAUSES  Women  Antibiotics.   Steroid medication taken for a long time.   Being overweight (obese).   Diabetes.   Poor immune condition.   Certain serious medical conditions.   Immune suppressive medications for organ transplant patients.   Chemotherapy.   Pregnancy.   Menstration.   Stress and fatigue.   Intravenous drug use.   Oral contraceptives.   Wearing tight-fitting clothes in the crotch area.   Catching it from a sex partner who has a yeast infection.   Spermicide.   Intravenous, urinary, or other catheters.  Men  Catching it from a sex partner who has a yeast infection.   Having oral or anal sex with a person who has the infection.   Spermicide.   Diabetes.   Antibiotics.   Poor immune system.   Medications that suppress the immune system.   Intravenous drug use.   Intravenous, urinary, or other catheters.  SYMPTOMS  Women  Thick, white vaginal discharge.    Vaginal itching.   Redness and swelling in and around the vagina.   Irritation of the lips of the vagina and perineum.   Blisters on the vaginal lips and perineum.   Painful sexual intercourse.   Low blood sugar (hypoglycemia).   Painful urination.   Bladder infections.   Intestinal problems such as constipation, indigestion, bad breath, bloating, increase in gas, diarrhea, or loose stools.  Men  Men may develop intestinal problems such as constipation, indigestion, bad breath, bloating, increase in gas, diarrhea, or loose stools.   Dry, cracked skin on the penis with itching or discomfort.   Jock itch.   Dry, flaky skin.   Athlete's foot.   Hypoglycemia.  DIAGNOSIS  Women  A history and an exam are performed.   The discharge may be examined under a microscope.   A culture may be taken of the discharge.  Men  A history and an exam are performed.   Any discharge from the penis or areas of cracked skin will be looked at under the microscope and cultured.   Stool samples may be cultured.  TREATMENT  Women  Vaginal antifungal suppositories and creams.   Medicated creams to decrease irritation and itching on the outside of the vagina.   Warm compresses to the perineal area to decrease swelling and discomfort.   Oral antifungal medications.   Medicated vaginal suppositories or cream for repeated or recurrent infections.   Wash and dry the irritation areas before applying the cream.     Eating yogurt with lactobacillus may help with prevention and treatment.   Sometimes painting the vagina with gentian violet solution may help if creams and suppositories do not work.  Men  Antifungal creams and oral antifungal medications.   Sometimes treatment must continue for 30 days after the symptoms go away to prevent recurrence.  HOME CARE INSTRUCTIONS  Women  Use cotton underwear and avoid tight-fitting clothing.   Avoid colored, scented toilet paper and  deodorant tampons or pads.   Do not douche.   Keep your diabetes under control.   Finish all the prescribed medications.   Keep your skin clean and dry.   Consume milk or yogurt with lactobacillus active culture regularly. If you get frequent yeast infections and think that is what the infection is, there are over-the-counter medications that you can get. If the infection does not show healing in 3 days, talk to your caregiver.   Tell your sex partner you have a yeast infection. Your partner may need treatment also, especially if your infection does not clear up or recurs.  Men  Keep your skin clean and dry.   Keep your diabetes under control.   Finish all prescribed medications.   Tell your sex partner that you have a yeast infection so they can be treated if necessary.  SEEK MEDICAL CARE IF:   Your symptoms do not clear up or worsen in one week after treatment.   You have an oral temperature above 102 F (38.9 C).   You have trouble swallowing or eating for a prolonged time.   You develop blisters on and around your vagina.   You develop vaginal bleeding and it is not your menstrual period.   You develop abdominal pain.   You develop intestinal problems as mentioned above.   You get weak or lightheaded.   You have painful or increased urination.   You have pain during sexual intercourse.  MAKE SURE YOU:   Understand these instructions.   Will watch your condition.   Will get help right away if you are not doing well or get worse.  Document Released: 09/08/2004 Document Revised: 07/21/2011 Document Reviewed: 12/21/2009 Exit[employer] Patient Information 2012 [employer]. 

## 2011-10-31 NOTE — Progress Notes (Signed)
Chief Complaint:  Gynecologic Exam   Maria [name] is  31 y.o. G1P0010.  Patient's last menstrual period was 10/10/2011.. She presents complaining of Gynecologic Exam  Pt presents for evaluation of vaginal discharge with itching and discomfort that started 2-3 days ago. Reports ext genitalia "painful" to touch. States daily doxycyline use for acne x 1 month. Denies irregular vag bleeding, abd pain, or genital lesions.   Obstetrical/Gynecological History: OB History    Grav Para Term Preterm Abortions TAB SAB Ect Mult Living   1 0 0  1 0 1         Past Medical History: Past Medical History  Diagnosis Date  . Reflux   . History of chlamydia   . Herpes     Takes Valtrex daily  . Endometriosis 1/12    Laps    Past Surgical History: Past Surgical History  Procedure Date  . Cholecystectomy 2007  . Laparoscopy 1/12    Rt ov cyst and endometriosis    Family History: Family History  Problem Relation Age of Onset  . Cancer Father     kidney and lung  . Kidney disease Mother   . Heart disease Mother   . Hypertension Mother   . Alcohol abuse Mother     recovered  . Heart disease Maternal Grandmother   . Heart disease Maternal Grandfather     Social History: History  Substance Use Topics  . Smoking status: Never Smoker   . Smokeless tobacco: Never Used  . Alcohol Use: 0.5 oz/week    1 drink(s) per week    Allergies: No Known Allergies   (Not in a hospital admission)  Review of Systems - Negative except what has been reviewed in HPI  Physical Exam   Blood pressure 130/87, pulse 93, temperature 98.5 F (36.9 C), temperature source Oral, resp. rate 16, height 5\' 5"  (1.651 m), weight 131 lb (59.421 kg), last menstrual period 10/10/2011.  General: General appearance - alert, well appearing, and in no distress, oriented to person, place, and time and normal appearing weight Mental status - alert, oriented to person, place, and time, normal mood, behavior, speech,  dress, motor activity, and thought processes, affect appropriate to mood Focused Gynecological Exam: VULVA: vulvar tenderness BL labis, vulvar erythema BL labia, without lesion, VAGINA: normal appearing vagina with normal color and discharge, no lesions, WET MOUNT done - results: + hyphae with buds, CERVIX: normal appearing cervix without discharge or lesions, UTERUS: uterus is normal size, shape, consistency and nontender, ADNEXA: normal adnexa in size, nontender and no masses   Assessment/Plan: 1. Yeast vaginitis  fluconazole (DIFLUCAN) 150 MG tablet, Lactobacillus (REPHRESH PRO-B) CAPS      Adlai Nieblas E. 10/31/2011,11:40 AM

## 2012-03-02 ENCOUNTER — Ambulatory Visit: Payer: BC Managed Care – PPO

## 2012-03-14 ENCOUNTER — Ambulatory Visit (INDEPENDENT_AMBULATORY_CARE_PROVIDER_SITE_OTHER): Payer: BC Managed Care – PPO | Admitting: Obstetrics & Gynecology

## 2012-03-14 ENCOUNTER — Encounter: Payer: Self-pay | Admitting: Obstetrics & Gynecology

## 2012-03-14 VITALS — BP 141/91 | HR 101 | Temp 98.3°F | Resp 17 | Wt 136.0 lb

## 2012-03-14 DIAGNOSIS — R102 Pelvic and perineal pain: Secondary | ICD-10-CM

## 2012-03-14 DIAGNOSIS — N949 Unspecified condition associated with female genital organs and menstrual cycle: Secondary | ICD-10-CM

## 2012-03-14 DIAGNOSIS — N809 Endometriosis, unspecified: Secondary | ICD-10-CM

## 2012-03-14 DIAGNOSIS — Z7251 High risk heterosexual behavior: Secondary | ICD-10-CM

## 2012-03-14 LAB — RPR

## 2012-03-14 LAB — HEPATITIS B SURFACE ANTIGEN: Hepatitis B Surface Ag: NEGATIVE

## 2012-03-14 MED ORDER — HYDROCODONE-ACETAMINOPHEN 5-500 MG PO TABS: 1.0000 | ORAL_TABLET | Freq: Four times a day (QID) | ORAL | Status: AC | PRN

## 2012-03-14 MED ORDER — IBUPROFEN 600 MG PO TABS: 600.0000 mg | ORAL_TABLET | Freq: Four times a day (QID) | ORAL | Status: AC | PRN

## 2012-03-14 MED ORDER — NORETHINDRONE ACETATE 5 MG PO TABS: 5.0000 mg | ORAL_TABLET | Freq: Every day | ORAL | Status: DC

## 2012-03-14 NOTE — Progress Notes (Signed)
  Subjective:    Patient ID: Maria [name], female    DOB: 07-May-1981, 31 y.o.   MRN: [id number]  HPI  31 yo SW G0 with biopsy proven endometriosis. She has tried OCPs but had trouble remembering them as well as weight gain. She then tried [place], but developed an ovarian cyst. She now has Paraguard in place for birth control. She is here today with severe LLQ pain that required her to take one half of her mother's vicodin pill. She is also taking Pamprin. She says her periods are also Very heavy.  Review of Systems     Objective:   Physical Exam  No adnexal masses      Assessment & Plan:  Pelvic pain due to endometriosis. IBU 600 mg and Vicodin prn. Continuous norethindrone. RTC 2 months if no better.

## 2012-03-14 NOTE — Addendum Note (Signed)
Addended by: Allie Bossier on: 03/14/2012 09:25 AM   Modules accepted: Orders

## 2012-03-15 LAB — HSV 2 ANTIBODY, IGG: HSV 2 Glycoprotein G Ab, IgG: 16.71 IV — ABNORMAL HIGH

## 2012-03-15 LAB — GC/CHLAMYDIA PROBE AMP, URINE
Chlamydia, Swab/Urine, PCR: NEGATIVE
GC Probe Amp, Urine: NEGATIVE

## 2012-03-16 ENCOUNTER — Telehealth: Payer: Self-pay | Admitting: *Deleted

## 2012-03-16 NOTE — Telephone Encounter (Signed)
Message copied by Granville Lewis on Fri Mar 16, 2012 11:15 AM ------      Message from: Allie Bossier      Created: Fri Mar 16, 2012  9:40 AM       Please let her know that she has been exposed to HSV2 in the past.

## 2012-03-16 NOTE — Telephone Encounter (Signed)
Pt notified of HSV exposure was positive.

## 2012-09-02 IMAGING — US US TRANSVAGINAL NON-OB
1 series · 13 of 25 positions shown · non-contrast
Comparison: 07/05/2010

CLINICAL DATA: Follow-up complex cystic right ovarian mass.

TRANSVAGINAL ULTRASOUND OF PELVIS
TECHNIQUE: Transvaginal ultrasound examination of the pelvis was
performed including evaluation of the uterus, ovaries, adnexal
regions, and pelvic cul-de-sac.

[Series 1: us transvaginal non-ob · 0.13mm/px · 43 acquisitions, 13 frames shown]
[im 1/43]
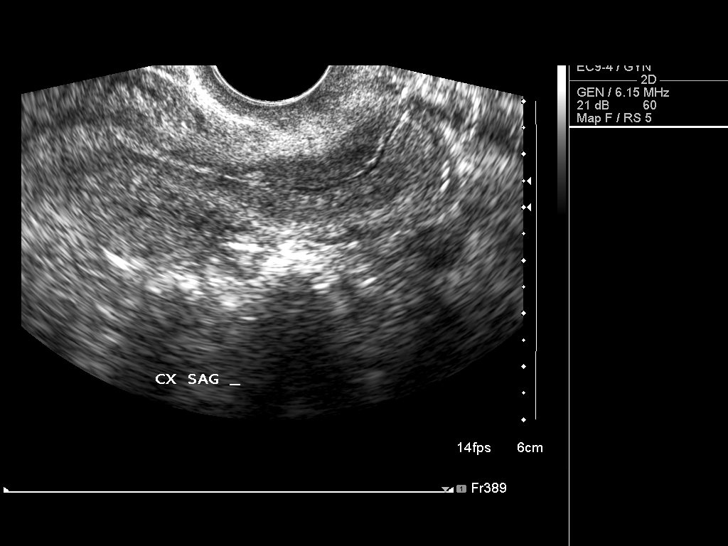
[im 4/43]
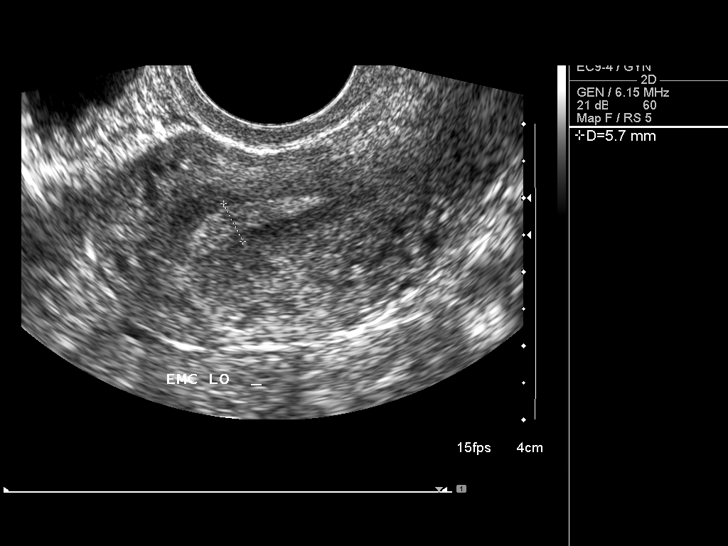
[im 8/43]
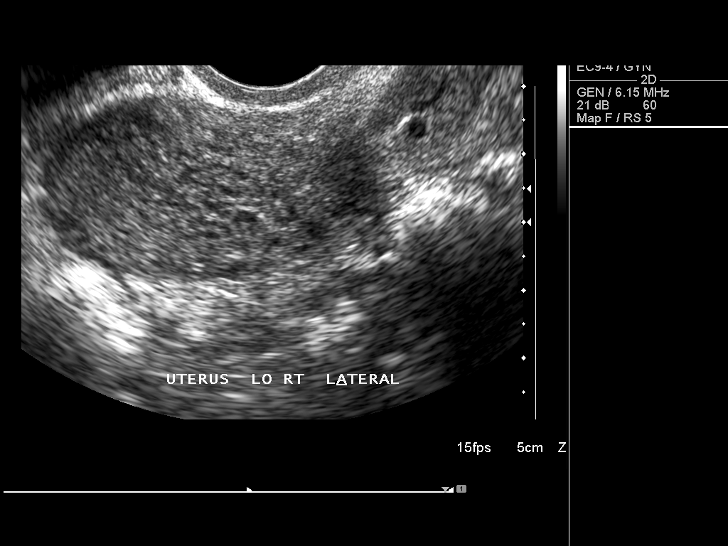
[im 11/43]
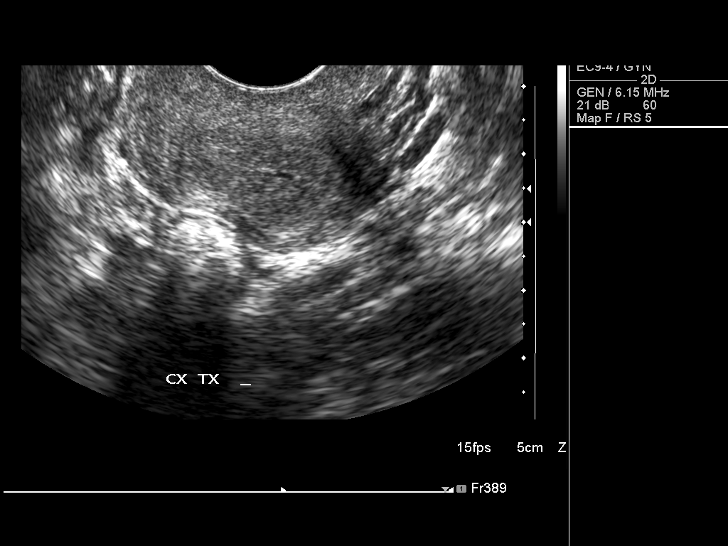
[im 15/43]
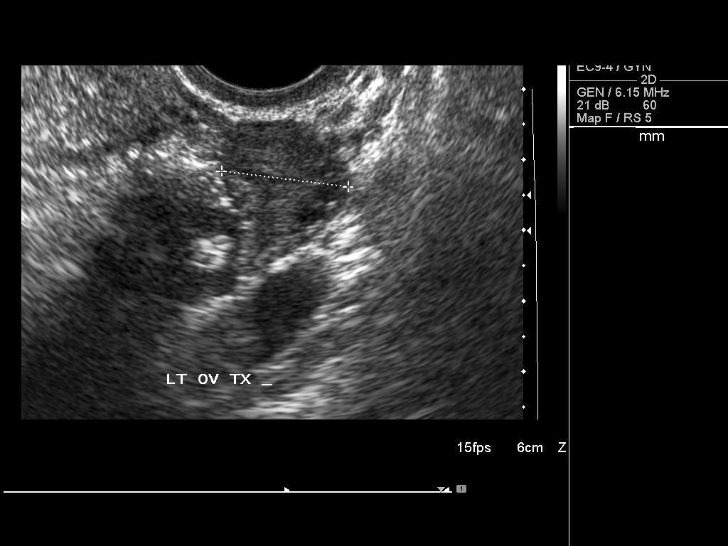
[im 18/43]
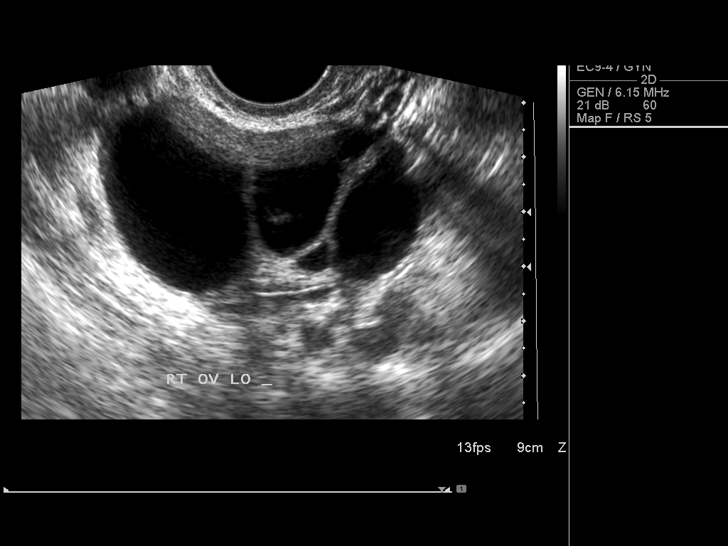
[im 22/43]
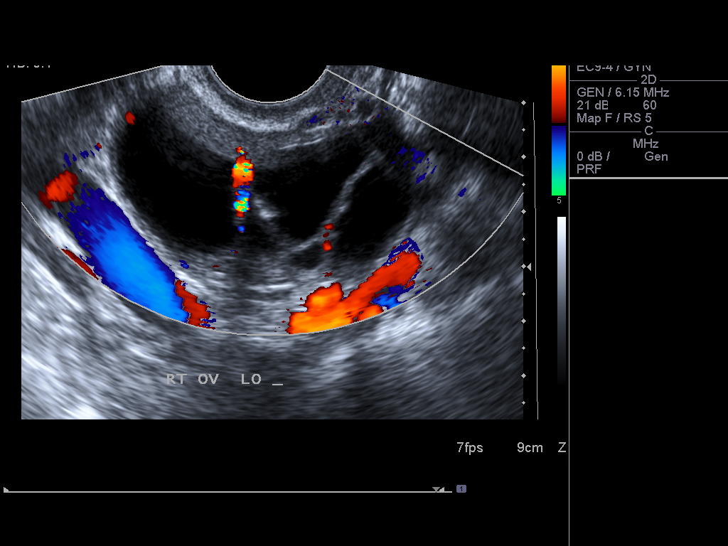
[im 25/43]
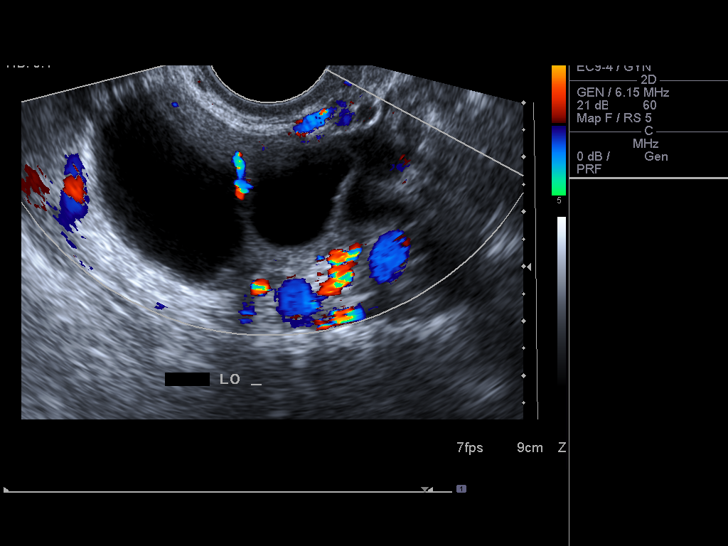
[im 29/43]
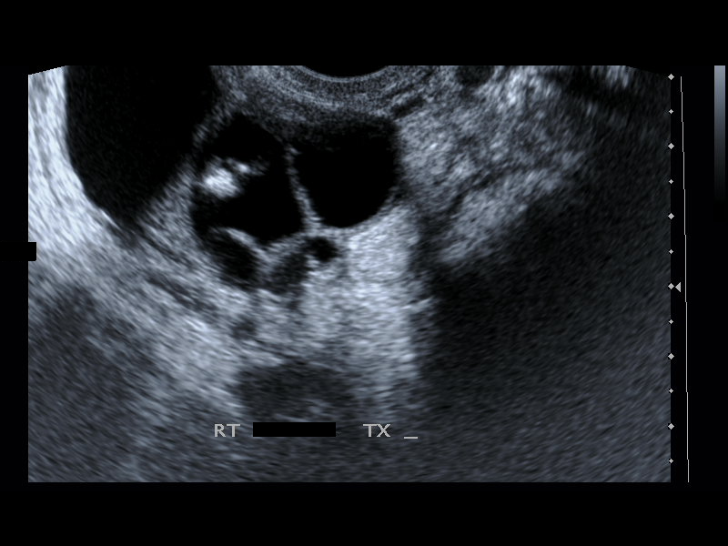
[im 32/43]
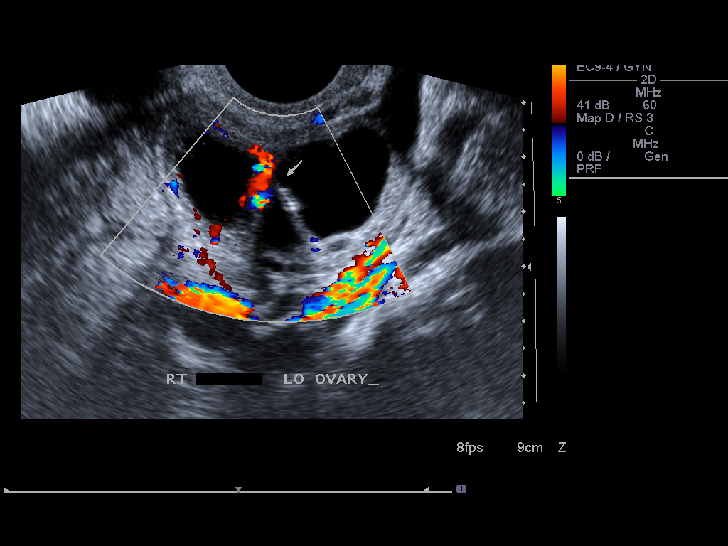
[im 36/43]
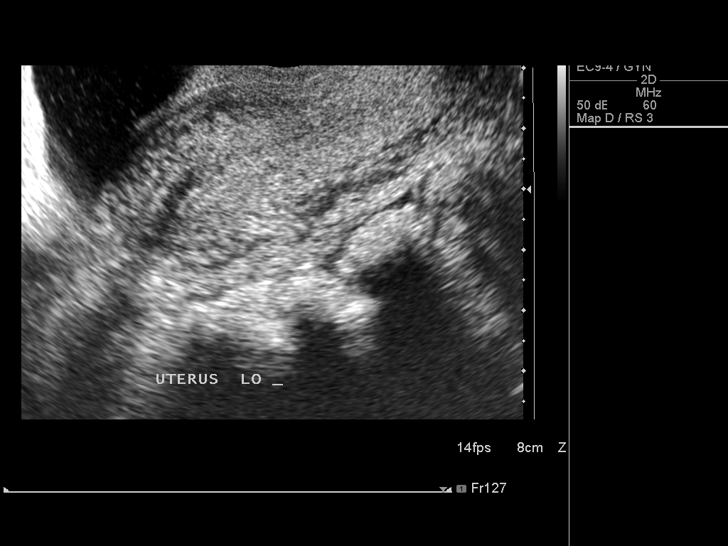
[im 39/43]
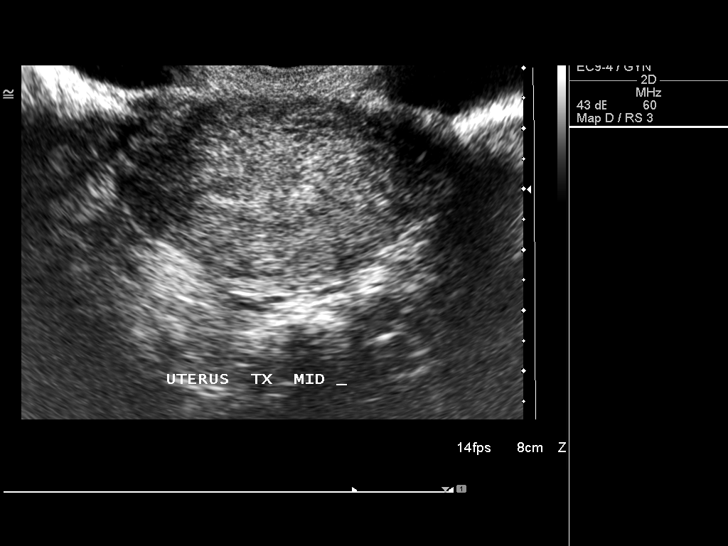
[im 43/43]
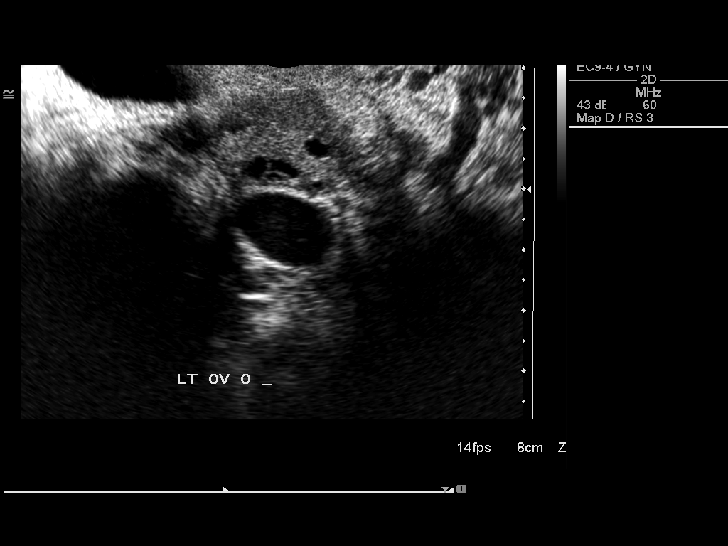

[13 of 25 positions shown; findings below may reference images not displayed]

FINDINGS: Uterus measures 8.5 by 3.7 by 4.7 cm.  No fibroids or other uterine
masses identified.

Endometrium measures 6 mm in thickness.  Within normal limits in
appearance.

Right Ovary measures 6.1 x 3.6 x 3.9 cm.  A complex cystic lesion
is seen involving the ovary which has multiple internal septations,
some which are thickened and have nodular foci.  Blood flow is also
seen within several septations on color Doppler ultrasound.  This
measures 5.9 x 2.5 by 3.4 cm, which is increased compared to
measurements obtained on prior study.

Left Ovary measures 2.5 x 2.2 x 1.8 cm.  Normal appearance.

Other Findings:  Trace amount of free fluid seen in the right
adnexa.
IMPRESSION: 1.  Mild increase in size of complex multi septated cystic lesion
involving the right ovary.  This is suspicious for cystic ovarian
neoplasm, and malignancy cannot definitely be excluded.  Consider
further evaluation with pelvic MRI (without and with contrast) and
possible surgical removal.
2.  Tiny amount of free fluid in right adnexa.
3.  Normal appearance of uterus and left ovary.

This recommendation follows the consensus statement:  Management of
Asymptomatic Ovarian and Other Adnexal Cysts Imaged at US:  Society
of Radiologists in Ultrasound Consensus Conference Statement.
[URL]

## 2013-02-21 ENCOUNTER — Ambulatory Visit: Payer: BC Managed Care – PPO | Admitting: Obstetrics & Gynecology

## 2013-02-21 DIAGNOSIS — N899 Noninflammatory disorder of vagina, unspecified: Secondary | ICD-10-CM

## 2013-02-28 ENCOUNTER — Ambulatory Visit: Payer: BC Managed Care – PPO | Admitting: Obstetrics & Gynecology

## 2013-03-21 ENCOUNTER — Other Ambulatory Visit: Payer: Self-pay | Admitting: *Deleted

## 2013-03-21 DIAGNOSIS — N949 Unspecified condition associated with female genital organs and menstrual cycle: Secondary | ICD-10-CM

## 2013-03-21 DIAGNOSIS — N938 Other specified abnormal uterine and vaginal bleeding: Secondary | ICD-10-CM

## 2013-03-21 MED ORDER — NORETHINDRONE ACETATE 5 MG PO TABS: 5.0000 mg | ORAL_TABLET | Freq: Every day | ORAL | Status: DC

## 2013-05-22 ENCOUNTER — Other Ambulatory Visit: Payer: Self-pay | Admitting: Obstetrics & Gynecology

## 2013-05-23 ENCOUNTER — Other Ambulatory Visit: Payer: Self-pay | Admitting: Obstetrics & Gynecology

## 2013-05-24 ENCOUNTER — Telehealth: Payer: Self-pay | Admitting: *Deleted

## 2013-05-24 DIAGNOSIS — N938 Other specified abnormal uterine and vaginal bleeding: Secondary | ICD-10-CM

## 2013-05-24 DIAGNOSIS — N949 Unspecified condition associated with female genital organs and menstrual cycle: Secondary | ICD-10-CM

## 2013-05-24 MED ORDER — NORETHINDRONE ACETATE 5 MG PO TABS: 5.0000 mg | ORAL_TABLET | Freq: Every day | ORAL | Status: DC

## 2013-05-24 NOTE — Telephone Encounter (Signed)
Rcvd fax from pharm adv 3rd request for refill - haven't rcvd the previous two refill request - e-refill confirm rcvd by pharm 03/21/13 - called pharm LMOM with refill info

## 2013-05-27 ENCOUNTER — Other Ambulatory Visit: Payer: Self-pay | Admitting: Obstetrics & Gynecology

## 2013-09-05 ENCOUNTER — Encounter: Payer: Self-pay | Admitting: Obstetrics & Gynecology

## 2013-09-05 ENCOUNTER — Ambulatory Visit (INDEPENDENT_AMBULATORY_CARE_PROVIDER_SITE_OTHER): Payer: BC Managed Care – PPO | Admitting: Obstetrics & Gynecology

## 2013-09-05 VITALS — BP 152/97 | HR 97 | Resp 16 | Ht 62.0 in | Wt 137.0 lb

## 2013-09-05 DIAGNOSIS — Z01419 Encounter for gynecological examination (general) (routine) without abnormal findings: Secondary | ICD-10-CM

## 2013-09-05 DIAGNOSIS — Z124 Encounter for screening for malignant neoplasm of cervix: Secondary | ICD-10-CM

## 2013-09-05 DIAGNOSIS — Z113 Encounter for screening for infections with a predominantly sexual mode of transmission: Secondary | ICD-10-CM

## 2013-09-05 DIAGNOSIS — Z0000 Encounter for general adult medical examination without abnormal findings: Secondary | ICD-10-CM

## 2013-09-05 DIAGNOSIS — Z1151 Encounter for screening for human papillomavirus (HPV): Secondary | ICD-10-CM

## 2013-09-05 MED ORDER — NORETHINDRONE ACETATE 5 MG PO TABS: 5.0000 mg | ORAL_TABLET | Freq: Every day | ORAL | Status: DC

## 2013-09-05 NOTE — Patient Instructions (Signed)

## 2013-09-05 NOTE — Progress Notes (Signed)
Subjective:    Maria [name] is a 33 y.o. female who presents for an annual exam. The patient has no complaints today. She would like a TSH checked.  The patient is sexually active. GYN screening history: last pap: was normal. The patient wears seatbelts: yes. The patient participates in regular exercise: yes. (cardio and weights) Has the patient ever been transfused or tattooed?: yes. The patient reports that there is not domestic violence in her life.   Menstrual History: OB History   Grav Para Term Preterm Abortions TAB SAB Ect Mult Living   1 0 0  1 0 1         Menarche age: [age] [place]: 10517   No LMP recorded. Patient is not currently having periods (Reason: IUD).    The following portions of the patient's history were reviewed and updated as appropriate: allergies, current medications, past family history, past medical history, past social history, past surgical history and problem list.  Review of Systems A comprehensive review of systems was negative. Works for [employer] for 9 years, monogamous for a year, denies dyspareunia.   Objective:    BP 152/101  Pulse 95  Resp 16  Ht 5\' 2"  (1.575 m)  Wt 137 lb (62.143 kg)  BMI 25.05 kg/m2  General Appearance:    Alert, cooperative, no distress, appears stated age  Head:    Normocephalic, without obvious abnormality, atraumatic  Eyes:    PERRL, conjunctiva/corneas clear, EOM's intact, fundi    benign, both eyes  Ears:    Normal TM's and external ear canals, both ears  Nose:   Nares normal, septum midline, mucosa normal, no drainage    or sinus tenderness  Throat:   Lips, mucosa, and tongue normal; teeth and gums normal  Neck:   Supple, symmetrical, trachea midline, no adenopathy;    thyroid:  no enlargement/tenderness/nodules; no carotid   bruit or JVD  Back:     Symmetric, no curvature, ROM normal, no CVA tenderness  Lungs:     Clear to auscultation bilaterally, respirations unlabored  Chest Wall:    No tenderness or deformity    Heart:    Regular rate and rhythm, S1 and S2 normal, no murmur, rub   or gallop  Breast Exam:    No tenderness, masses, or nipple abnormality  Abdomen:     Soft, non-tender, bowel sounds active all four quadrants,    no masses, no organomegaly  Genitalia:    Normal female without lesion, discharge or tenderness     Extremities:   Extremities normal, atraumatic, no cyanosis or edema  Pulses:   2+ and symmetric all extremities  Skin:   Skin color, texture, turgor normal, no rashes or lesions  Lymph nodes:   Cervical, supraclavicular, and axillary nodes normal  Neurologic:   CNII-XII intact, normal strength, sensation and reflexes    throughout  .    Assessment:    Healthy female exam.    Plan:     Breast self exam technique reviewed and patient encouraged to perform self-exam monthly. Chlamydia specimen. GC specimen. Thin prep Pap smear. with cotesting TSH Rec stop smoking. Start MVI with folic

## 2013-09-06 ENCOUNTER — Telehealth: Payer: Self-pay | Admitting: *Deleted

## 2013-09-06 LAB — TSH: TSH: 0.996 u[IU]/mL (ref 0.350–4.500)

## 2013-09-06 NOTE — Telephone Encounter (Signed)
Pt notified of normal TSH.  She will check her schedule and call back for some fasting labs per Dr Marice Potterove.

## 2014-06-16 ENCOUNTER — Encounter: Payer: Self-pay | Admitting: Obstetrics & Gynecology

## 2014-10-02 ENCOUNTER — Other Ambulatory Visit: Payer: Self-pay | Admitting: Obstetrics & Gynecology

## 2014-10-06 ENCOUNTER — Other Ambulatory Visit: Payer: Self-pay | Admitting: Obstetrics & Gynecology

## 2014-10-08 ENCOUNTER — Other Ambulatory Visit: Payer: Self-pay | Admitting: Obstetrics & Gynecology

## 2014-10-10 ENCOUNTER — Ambulatory Visit (INDEPENDENT_AMBULATORY_CARE_PROVIDER_SITE_OTHER): Payer: 59 | Admitting: Family

## 2014-10-10 ENCOUNTER — Encounter: Payer: Self-pay | Admitting: Family

## 2014-10-10 VITALS — BP 137/90 | HR 80 | Resp 16 | Ht 62.0 in | Wt 141.0 lb

## 2014-10-10 DIAGNOSIS — Z124 Encounter for screening for malignant neoplasm of cervix: Secondary | ICD-10-CM

## 2014-10-10 DIAGNOSIS — Z113 Encounter for screening for infections with a predominantly sexual mode of transmission: Secondary | ICD-10-CM

## 2014-10-10 DIAGNOSIS — Z224 Carrier of infections with a predominantly sexual mode of transmission: Secondary | ICD-10-CM

## 2014-10-10 DIAGNOSIS — Z1151 Encounter for screening for human papillomavirus (HPV): Secondary | ICD-10-CM

## 2014-10-10 DIAGNOSIS — Z30432 Encounter for removal of intrauterine contraceptive device: Secondary | ICD-10-CM

## 2014-10-10 DIAGNOSIS — A6000 Herpesviral infection of urogenital system, unspecified: Secondary | ICD-10-CM

## 2014-10-10 DIAGNOSIS — Z30018 Encounter for initial prescription of other contraceptives: Secondary | ICD-10-CM

## 2014-10-10 DIAGNOSIS — Z01419 Encounter for gynecological examination (general) (routine) without abnormal findings: Secondary | ICD-10-CM

## 2014-10-10 NOTE — Progress Notes (Signed)
  Subjective:     Maria PrimroseLindsay [name] is a 34 y.o. female and is here for a comprehensive physical exam. The patient reports the desire to remove paraguard.  In monogomous relationship x 2 years.  Also has been taking PO progesterone to prevent menstruation.  Okay with having a baby at this time.  Desires to use Nuvaring.  Pt reviews history of +HSV II lab result but no history of vaginal lesions.  Reports having painful lesions on upper buttocks.    History   Social History  . Marital Status: Single    Spouse Name: N/A  . Number of Children: N/A  . Years of Education: N/A   Occupational History  . banker    Social History Main Topics  . Smoking status: Light Tobacco Smoker -- 0.25 packs/day for 1 years    Types: Cigarettes  . Smokeless tobacco: Never Used  . Alcohol Use: 0.5 oz/week    1 drink(s) per week  . Drug Use: No  . Sexual Activity:    Partners: Male    Birth Control/ Protection: Other-see comments     Comment: nuvaring   Other Topics Concern  . Not on file   Social History Narrative   Health Maintenance  Topic Date Due  . Janet BerlinETANUS/TDAP  07/19/2001  . INFLUENZA VACCINE  03/15/2014  . PAP SMEAR  09/05/2016  . HIV Screening  Completed    The following portions of the patient's history were reviewed and updated as appropriate: allergies, current medications, past family history, past medical history, past social history, past surgical history and problem list.  Review of Systems Pertinent items are noted in HPI.   Objective:   BP 137/90 mmHg  Pulse 80  Resp 16  Ht 5\' 2"  (1.575 m)  Wt 141 lb (63.957 kg)  BMI 25.78 kg/m2 General appearance: alert, cooperative and appears stated age Head: Normocephalic, without obvious abnormality, atraumatic Neck: no adenopathy, no carotid bruit, no JVD, supple, symmetrical, trachea midline and thyroid not enlarged, symmetric, no tenderness/mass/nodules Lungs: clear to auscultation bilaterally Breasts: normal appearance, no  masses or tenderness, No nipple retraction or dimpling, No nipple discharge or bleeding, No axillary or supraclavicular adenopathy, Normal to palpation without dominant masses, Taught monthly breast self examination Heart: regular rate and rhythm, S1, S2 normal, no murmur, click, rub or gallop Abdomen: soft, non-tender; bowel sounds normal; no masses,  no organomegaly Pelvic: cervix normal in appearance, external genitalia normal, no adnexal masses or tenderness, no cervical motion tenderness, rectovaginal septum normal, uterus normal size, shape, and consistency and vagina normal without discharge Skin: Skin color, texture, turgor normal. Small papule lesions on upper buttocks.       IUD Removal:   Pt placed in lithotomy position.  Speculum inserted and  IUD removed without difficulty.  Speculum removed.  Assessment:   Well Woman Exam  Skin Lesions - likely HSV IUD Removal  Plan:     Explained pregnancy is possible.  Discontinue PO progesterone use. Begin Nuvaring Sunday.  Use back up method x one week.   Pap smear sent to lab. Explained possible to have HSV lesions in area other than vagina.  Eino FarberWalidah Kennith GainN Karim, CNM

## 2014-10-14 LAB — CYTOLOGY - PAP

## 2014-10-19 ENCOUNTER — Encounter: Payer: Self-pay | Admitting: Family

## 2014-10-19 DIAGNOSIS — IMO0002 Reserved for concepts with insufficient information to code with codable children: Secondary | ICD-10-CM | POA: Insufficient documentation

## 2015-09-29 ENCOUNTER — Encounter: Payer: Self-pay | Admitting: Obstetrics & Gynecology

## 2015-09-29 ENCOUNTER — Ambulatory Visit (INDEPENDENT_AMBULATORY_CARE_PROVIDER_SITE_OTHER): Payer: 59 | Admitting: Obstetrics & Gynecology

## 2015-09-29 VITALS — BP 146/98 | HR 104 | Resp 16 | Ht 62.0 in | Wt 138.0 lb

## 2015-09-29 DIAGNOSIS — Z3202 Encounter for pregnancy test, result negative: Secondary | ICD-10-CM

## 2015-09-29 DIAGNOSIS — Z3200 Encounter for pregnancy test, result unknown: Secondary | ICD-10-CM

## 2015-09-29 DIAGNOSIS — Z3161 Procreative counseling and advice using natural family planning: Secondary | ICD-10-CM | POA: Diagnosis not present

## 2015-09-29 LAB — POCT URINE PREGNANCY: Preg Test, Ur: NEGATIVE

## 2015-09-29 NOTE — Progress Notes (Signed)
   Subjective:    Patient ID: Melvin Marmo, female    DOB: [birth year], 35 y.o.   MRN: [id number]  HPI  35 yo MW G0 here today to discuss getting pregnant. She is taking PNVs. She had a paraguard until 2/16 and then used Nuvaring until 12/16. She is doing OVKs.   Review of Systems She does Med mal insurance, [employer]    Objective:   Physical Exam WNWHWFNAD Breathing, conversing, and ambulating normally       Assessment & Plan:  Preconceptional counseling- Continue PNVs Because her fiance was shot in his testicle (and it was removed), he will get a semen analysis

## 2015-10-12 ENCOUNTER — Ambulatory Visit: Payer: 59 | Admitting: Obstetrics & Gynecology

## 2015-12-09 ENCOUNTER — Ambulatory Visit (INDEPENDENT_AMBULATORY_CARE_PROVIDER_SITE_OTHER): Payer: 59 | Admitting: Psychology

## 2015-12-09 DIAGNOSIS — F4323 Adjustment disorder with mixed anxiety and depressed mood: Secondary | ICD-10-CM | POA: Diagnosis not present

## 2015-12-23 ENCOUNTER — Ambulatory Visit (INDEPENDENT_AMBULATORY_CARE_PROVIDER_SITE_OTHER): Payer: 59 | Admitting: Psychology

## 2015-12-23 DIAGNOSIS — F4323 Adjustment disorder with mixed anxiety and depressed mood: Secondary | ICD-10-CM

## 2016-01-06 ENCOUNTER — Ambulatory Visit: Payer: 59 | Admitting: Psychology

## 2016-01-20 ENCOUNTER — Ambulatory Visit (INDEPENDENT_AMBULATORY_CARE_PROVIDER_SITE_OTHER): Payer: 59 | Admitting: Psychology

## 2016-01-20 DIAGNOSIS — F4323 Adjustment disorder with mixed anxiety and depressed mood: Secondary | ICD-10-CM

## 2016-02-03 ENCOUNTER — Ambulatory Visit (INDEPENDENT_AMBULATORY_CARE_PROVIDER_SITE_OTHER): Payer: 59 | Admitting: Psychology

## 2016-02-03 DIAGNOSIS — F4323 Adjustment disorder with mixed anxiety and depressed mood: Secondary | ICD-10-CM | POA: Diagnosis not present

## 2016-02-17 ENCOUNTER — Ambulatory Visit: Payer: 59 | Admitting: Psychology

## 2016-02-25 ENCOUNTER — Encounter: Payer: Self-pay | Admitting: Obstetrics & Gynecology

## 2016-02-25 ENCOUNTER — Ambulatory Visit (INDEPENDENT_AMBULATORY_CARE_PROVIDER_SITE_OTHER): Payer: 59 | Admitting: Obstetrics & Gynecology

## 2016-02-25 VITALS — BP 127/81 | HR 98 | Resp 16 | Ht 62.0 in | Wt 137.0 lb

## 2016-02-25 DIAGNOSIS — Z1151 Encounter for screening for human papillomavirus (HPV): Secondary | ICD-10-CM

## 2016-02-25 DIAGNOSIS — Z01419 Encounter for gynecological examination (general) (routine) without abnormal findings: Secondary | ICD-10-CM

## 2016-02-25 DIAGNOSIS — Z124 Encounter for screening for malignant neoplasm of cervix: Secondary | ICD-10-CM

## 2016-02-25 NOTE — Progress Notes (Signed)
Subjective:    Maria [name] is a 35 y.o. engagedW G0 female who presents for an annual exam. The patient has no complaints today. The patient is sexually active. GYN screening history: last pap: was normal. The patient wears seatbelts: yes. The patient participates in regular exercise: yes. Has the patient ever been transfused or tattooed?: yes. The patient reports that there is not domestic violence in her life.   Menstrual History: OB History    Gravida Para Term Preterm AB TAB SAB Ectopic Multiple Living   1 0 0  1 0 1         Menarche age: [age]  Patient's last menstrual period was 02/11/2016.    The following portions of the patient's history were reviewed and updated as appropriate: allergies, current medications, past family history, past medical history, past social history, past surgical history and problem list.  Review of Systems Pertinent items are noted in HPI.  Periods are regular, bleeds for 3-4 days, denies dyspareunia.   Objective:    BP 127/81 mmHg  Pulse 98  Resp 16  Ht 5\' 2"  (1.575 m)  Wt 137 lb (62.143 kg)  BMI 25.05 kg/m2  LMP 02/11/2016  General Appearance:    Alert, cooperative, no distress, appears stated age  Head:    Normocephalic, without obvious abnormality, atraumatic  Eyes:    PERRL, conjunctiva/corneas clear, EOM's intact, fundi    benign, both eyes  Ears:    Normal TM's and external ear canals, both ears  Nose:   Nares normal, septum midline, mucosa normal, no drainage    or sinus tenderness  Throat:   Lips, mucosa, and tongue normal; teeth and gums normal  Neck:   Supple, symmetrical, trachea midline, no adenopathy;    thyroid:  no enlargement/tenderness/nodules; no carotid   bruit or JVD  Back:     Symmetric, no curvature, ROM normal, no CVA tenderness  Lungs:     Clear to auscultation bilaterally, respirations unlabored  Chest Wall:    No tenderness or deformity   Heart:    Regular rate and rhythm, S1 and S2 normal, no murmur, rub   or  gallop  Breast Exam:    No tenderness, masses, or nipple abnormality  Abdomen:     Soft, non-tender, bowel sounds active all four quadrants,    no masses, no organomegaly  Genitalia:    Normal female without lesion, discharge or tenderness, NSSR, NT, minimal mobility, no adnexal masses     Extremities:   Extremities normal, atraumatic, no cyanosis or edema  Pulses:   2+ and symmetric all extremities  Skin:   Skin color, texture, turgor normal, no rashes or lesions  Lymph nodes:   Cervical, supraclavicular, and axillary nodes normal  Neurologic:   CNII-XII intact, normal strength, sensation and reflexes    throughout  .    Assessment:    Healthy female exam.   Desire for conception   Plan:     Thin prep Pap smear. with cotesting Continue MVI Semen analysis

## 2016-02-29 LAB — CYTOLOGY - PAP

## 2016-03-10 ENCOUNTER — Ambulatory Visit (INDEPENDENT_AMBULATORY_CARE_PROVIDER_SITE_OTHER): Payer: 59 | Admitting: Obstetrics & Gynecology

## 2016-03-10 ENCOUNTER — Encounter: Payer: Self-pay | Admitting: Obstetrics & Gynecology

## 2016-03-10 VITALS — BP 137/96 | HR 104 | Wt 140.0 lb

## 2016-03-10 DIAGNOSIS — Z3202 Encounter for pregnancy test, result negative: Secondary | ICD-10-CM

## 2016-03-10 DIAGNOSIS — IMO0002 Reserved for concepts with insufficient information to code with codable children: Secondary | ICD-10-CM

## 2016-03-10 LAB — POCT URINE PREGNANCY: Preg Test, Ur: NEGATIVE

## 2016-03-16 ENCOUNTER — Telehealth: Payer: Self-pay

## 2016-03-16 NOTE — Telephone Encounter (Signed)
Left a message for the pt explaining the results.

## 2016-04-29 ENCOUNTER — Encounter: Payer: Self-pay | Admitting: *Deleted

## 2016-04-29 ENCOUNTER — Telehealth: Payer: Self-pay | Admitting: *Deleted

## 2016-04-29 NOTE — Telephone Encounter (Signed)
Copy of Semen analysis sent to Dr Penne LashLeggett for review.  A copy of results mailed to pt's home address.

## 2016-05-24 NOTE — Progress Notes (Signed)
   Subjective:    Patient ID: Maria PrimroseLindsay Prillaman, female    DOB: 08/02/1981, 35 y.o.   MRN: [id number]  HPI Here today with a questionable vaginal discharge   Review of Systems     Objective:   Physical Exam  WNWHWFNAD During the speculum exam I saw an area at the top of her vagina that was somewhat pale. I biopsied it. She tolerated the procedure well.      Assessment & Plan:  Await pathology

## 2016-06-06 ENCOUNTER — Telehealth: Payer: Self-pay | Admitting: *Deleted

## 2016-06-06 ENCOUNTER — Other Ambulatory Visit: Payer: Self-pay

## 2016-06-06 DIAGNOSIS — Z309 Encounter for contraceptive management, unspecified: Secondary | ICD-10-CM

## 2016-06-06 MED ORDER — ETONOGESTREL-ETHINYL ESTRADIOL 0.12-0.015 MG/24HR VA RING: VAGINAL_RING | VAGINAL | 12 refills | Status: DC

## 2016-06-06 NOTE — Telephone Encounter (Signed)
Pt called requesting to be put back on Nuvaring because she has put the fertility on hold at the present.  Spoke with Dr Marice Potterove who agrees to resume the RX for Nuvaring.

## 2016-10-19 ENCOUNTER — Encounter: Payer: Self-pay | Admitting: Obstetrics & Gynecology

## 2016-10-19 ENCOUNTER — Ambulatory Visit (INDEPENDENT_AMBULATORY_CARE_PROVIDER_SITE_OTHER): Payer: 59 | Admitting: Obstetrics & Gynecology

## 2016-10-19 VITALS — BP 135/93 | HR 90 | Ht 62.0 in | Wt 140.0 lb

## 2016-10-19 DIAGNOSIS — N6320 Unspecified lump in the left breast, unspecified quadrant: Secondary | ICD-10-CM

## 2016-10-19 MED ORDER — VALACYCLOVIR HCL 1 G PO TABS: 1000.0000 mg | ORAL_TABLET | Freq: Every day | ORAL | 6 refills | Status: AC

## 2016-10-19 NOTE — Progress Notes (Signed)
   Subjective:    Patient ID: Maria [name], female    DOB: 11/13/1980, 36 y.o.   MRN: [id number]  HPI 36 yo engaged W G0 here for a 1 year h/o left breast mass that seems to be getting bigger. She denies nipple discharge or skin changes. No FH of breast cancer.  Review of Systems     Objective:   Physical Exam Pleasant WNWHWFNAD Breathing, conversing, and ambulating normally Large pectoral muscles No suspicious breast masses She does have bilateral fibrocystic changes, left more than right  No skin changes or nipple discharge       Assessment & Plan:  Breast mass- will order diagnostic mammo and u/s for reassurance Desire for pregnancy- has not used contraception for 2 years, taking MVI daily Refer to Dr. April MansonYalcinkaya She is going on her honeymoon next month and would like a prescription for valtrex (gets outbreaks on her lower back with tanning).

## 2016-10-19 NOTE — Addendum Note (Signed)
Addended by: Kathie DikeSOLA, DEANNA J on: 10/19/2016 10:38 AM   Modules accepted: Orders

## 2016-10-20 ENCOUNTER — Other Ambulatory Visit: Payer: Self-pay

## 2016-10-20 ENCOUNTER — Other Ambulatory Visit (HOSPITAL_COMMUNITY): Payer: Self-pay | Admitting: Obstetrics & Gynecology

## 2016-10-20 ENCOUNTER — Other Ambulatory Visit: Payer: Self-pay | Admitting: Obstetrics & Gynecology

## 2016-10-20 DIAGNOSIS — N6320 Unspecified lump in the left breast, unspecified quadrant: Secondary | ICD-10-CM

## 2016-10-21 ENCOUNTER — Ambulatory Visit
Admission: RE | Admit: 2016-10-21 | Discharge: 2016-10-21 | Disposition: A | Discharge status: Home / Self Care | Payer: 59 | Source: Ambulatory Visit | Attending: Obstetrics & Gynecology | Admitting: Obstetrics & Gynecology

## 2016-10-21 ENCOUNTER — Other Ambulatory Visit: Payer: Self-pay

## 2016-10-21 DIAGNOSIS — N6320 Unspecified lump in the left breast, unspecified quadrant: Secondary | ICD-10-CM

## 2016-11-08 ENCOUNTER — Other Ambulatory Visit: Payer: Self-pay | Admitting: Obstetrics & Gynecology

## 2016-11-08 DIAGNOSIS — N6489 Other specified disorders of breast: Secondary | ICD-10-CM

## 2017-05-26 ENCOUNTER — Ambulatory Visit
Admission: RE | Admit: 2017-05-26 | Discharge: 2017-05-26 | Disposition: A | Discharge status: Home / Self Care | Payer: 59 | Source: Ambulatory Visit | Attending: Obstetrics & Gynecology | Admitting: Obstetrics & Gynecology

## 2017-05-26 ENCOUNTER — Other Ambulatory Visit: Payer: Self-pay | Admitting: Obstetrics & Gynecology

## 2017-05-26 ENCOUNTER — Ambulatory Visit: Payer: Self-pay

## 2017-05-26 DIAGNOSIS — N6489 Other specified disorders of breast: Secondary | ICD-10-CM

## 2018-01-03 ENCOUNTER — Encounter: Payer: Self-pay | Admitting: Obstetrics & Gynecology

## 2018-01-18 ENCOUNTER — Ambulatory Visit (INDEPENDENT_AMBULATORY_CARE_PROVIDER_SITE_OTHER): Payer: 59 | Admitting: Obstetrics & Gynecology

## 2018-01-18 ENCOUNTER — Encounter: Payer: Self-pay | Admitting: Obstetrics & Gynecology

## 2018-01-18 VITALS — BP 127/90 | HR 97 | Ht 62.0 in | Wt 145.0 lb

## 2018-01-18 DIAGNOSIS — Z1151 Encounter for screening for human papillomavirus (HPV): Secondary | ICD-10-CM | POA: Diagnosis not present

## 2018-01-18 DIAGNOSIS — R102 Pelvic and perineal pain: Secondary | ICD-10-CM | POA: Diagnosis not present

## 2018-01-18 DIAGNOSIS — Z01419 Encounter for gynecological examination (general) (routine) without abnormal findings: Secondary | ICD-10-CM

## 2018-01-18 DIAGNOSIS — Z124 Encounter for screening for malignant neoplasm of cervix: Secondary | ICD-10-CM | POA: Diagnosis not present

## 2018-01-18 DIAGNOSIS — Z113 Encounter for screening for infections with a predominantly sexual mode of transmission: Secondary | ICD-10-CM

## 2018-01-18 NOTE — Progress Notes (Signed)
Subjective:    Maria [name] is a 37 y.o.marrried P0  female who presents for an annual exam. She has a 18 day history of fairly severe pain, worse on the left. IBU not helping. Nothing makes it worse.  The patient is sexually active. GYN screening history: last pap: was normal. The patient wears seatbelts: yes. The patient participates in regular exercise: yes. Has the patient ever been transfused or tattooed?: yes. The patient reports that there is not domestic violence in her life.   Menstrual History: OB History    Gravida  1   Para  0   Term  0   Preterm      AB  1   Living        SAB  1   TAB  0   Ectopic      Multiple      Live Births              Menarche age: [age] Patient's last menstrual period was 12/31/2017.    The following portions of the patient's history were reviewed and updated as appropriate: allergies, current medications, past family history, past medical history, past social history, past surgical history and problem list.  Review of Systems Pertinent items are noted in HPI.   FH- no breast/gyn/colon cancer Has been seeing Dr. April MansonYalcinkaya, next step is IVF but no funds right now   Objective:    BP 127/90   Pulse 97   Ht 5\' 2"  (1.575 m)   Wt 145 lb (65.8 kg)   LMP 12/31/2017   BMI 26.52 kg/m   General Appearance:    Alert, cooperative, no distress, appears stated age  Head:    Normocephalic, without obvious abnormality, atraumatic  Eyes:    PERRL, conjunctiva/corneas clear, EOM's intact, fundi    benign, both eyes  Ears:    Normal TM's and external ear canals, both ears  Nose:   Nares normal, septum midline, mucosa normal, no drainage    or sinus tenderness  Throat:   Lips, mucosa, and tongue normal; teeth and gums normal  Neck:   Supple, symmetrical, trachea midline, no adenopathy;    thyroid:  no enlargement/tenderness/nodules; no carotid   bruit or JVD  Back:     Symmetric, no curvature, ROM normal, no CVA tenderness  Lungs:      Clear to auscultation bilaterally, respirations unlabored  Chest Wall:    No tenderness or deformity   Heart:    Regular rate and rhythm, S1 and S2 normal, no murmur, rub   or gallop  Breast Exam:    No tenderness, masses, or nipple abnormality  Abdomen:     Soft, non-tender, bowel sounds active all four quadrants,    no masses, no organomegaly  Genitalia:    Normal female without lesion, discharge or tenderness, normal size and shape, anteverted, mobile, non-tender, normal adnexal exam      Extremities:   Extremities normal, atraumatic, no cyanosis or edema  Pulses:   2+ and symmetric all extremities  Skin:   Skin color, texture, turgor normal, no rashes or lesions  Lymph nodes:   Cervical, supraclavicular, and axillary nodes normal  Neurologic:   CNII-XII intact, normal strength, sensation and reflexes    throughout  .    Assessment:    Healthy female exam.    Plan:     Thin prep Pap smear. cervical cultures, cotesting Gyn u/s for pelvic pain

## 2018-01-23 LAB — CYTOLOGY - PAP
CHLAMYDIA, DNA PROBE: NEGATIVE
DIAGNOSIS: NEGATIVE
HPV (WINDOPATH): NOT DETECTED
NEISSERIA GONORRHEA: NEGATIVE

## 2018-01-26 ENCOUNTER — Other Ambulatory Visit: Payer: Self-pay

## 2019-08-16 NOTE — L&D Delivery Note (Signed)
OB/GYN Faculty Practice Delivery Note  Maria [name] is a 39 y.o. G2P0010 s/p preterm vaginal delivery at [redacted]w[redacted]d. She was admitted for preterm labor.   ROM: 2130 on 08/12/20 (at home) with clear fluid GBS Status: unknown; ampicillin started on admission but inadequate antibiotics prior to delivery given precipitous delivery Maximum Maternal Temperature: 98.101F  Labor Progress: Pt presented in active labor s/p SROM at home occurring at approximately 2130 on 08/12/20. She rapidly progressed to complete cervical dilation and had an uncomplicated delivery as noted below.  Delivery Date/Time: 0008 on 08/13/20 Delivery: Called to room and patient was complete and pushing. Head delivered direct OA. No nuchal cord present. Shoulder and body delivered in usual fashion. Infant with spontaneous cry, placed on mother's abdomen, dried and stimulated. Cord clamped x 2 after 1-minute delay, and cut by FOB under my direct supervision. Cord blood drawn. Placenta delivered spontaneously with gentle cord traction. Fundus firm with massage and Pitocin. Labia, perineum, vagina, and cervix were inspected, notable for right sulcal laceration s/p repair in standard fashion with use of 3-0 vicryl.   Placenta: 3-vessel cord, intact, sent to path given preterm delivery Complications: none Lacerations: right sulcal laceration s/p repair in standard fashion with use of 3-0 vicryl EBL: 200 ml Analgesia: IV fentanyl, lidocaine for repair  Infant: female  APGARs 9 & 9  weight per medical record  Lynnda Shields, MD OB/GYN Fellow, Faculty Practice

## 2019-09-23 DIAGNOSIS — Z20822 Contact with and (suspected) exposure to covid-19: Secondary | ICD-10-CM | POA: Diagnosis not present

## 2019-09-28 DIAGNOSIS — U071 COVID-19: Secondary | ICD-10-CM | POA: Diagnosis not present

## 2019-10-01 DIAGNOSIS — J329 Chronic sinusitis, unspecified: Secondary | ICD-10-CM | POA: Diagnosis not present

## 2019-10-01 DIAGNOSIS — Z9189 Other specified personal risk factors, not elsewhere classified: Secondary | ICD-10-CM | POA: Diagnosis not present

## 2019-10-01 DIAGNOSIS — U071 COVID-19: Secondary | ICD-10-CM | POA: Diagnosis not present

## 2019-10-01 DIAGNOSIS — B9689 Other specified bacterial agents as the cause of diseases classified elsewhere: Secondary | ICD-10-CM | POA: Diagnosis not present

## 2019-10-09 DIAGNOSIS — Z1151 Encounter for screening for human papillomavirus (HPV): Secondary | ICD-10-CM | POA: Diagnosis not present

## 2019-10-09 DIAGNOSIS — Z01419 Encounter for gynecological examination (general) (routine) without abnormal findings: Secondary | ICD-10-CM | POA: Diagnosis not present

## 2019-10-09 DIAGNOSIS — Z6825 Body mass index (BMI) 25.0-25.9, adult: Secondary | ICD-10-CM | POA: Diagnosis not present

## 2019-12-03 DIAGNOSIS — R87612 Low grade squamous intraepithelial lesion on cytologic smear of cervix (LGSIL): Secondary | ICD-10-CM | POA: Diagnosis not present

## 2019-12-03 DIAGNOSIS — Z3149 Encounter for other procreative investigation and testing: Secondary | ICD-10-CM | POA: Diagnosis not present

## 2019-12-03 DIAGNOSIS — N870 Mild cervical dysplasia: Secondary | ICD-10-CM | POA: Diagnosis not present

## 2019-12-05 ENCOUNTER — Encounter: Payer: 59 | Admitting: Obstetrics and Gynecology

## 2019-12-27 DIAGNOSIS — N979 Female infertility, unspecified: Secondary | ICD-10-CM | POA: Diagnosis not present

## 2020-01-14 DIAGNOSIS — Z3689 Encounter for other specified antenatal screening: Secondary | ICD-10-CM | POA: Diagnosis not present

## 2020-01-14 DIAGNOSIS — Z3200 Encounter for pregnancy test, result unknown: Secondary | ICD-10-CM | POA: Diagnosis not present

## 2020-02-18 ENCOUNTER — Encounter: Payer: Self-pay | Admitting: *Deleted

## 2020-02-20 ENCOUNTER — Ambulatory Visit (INDEPENDENT_AMBULATORY_CARE_PROVIDER_SITE_OTHER): Payer: BC Managed Care – PPO | Admitting: Obstetrics and Gynecology

## 2020-02-20 ENCOUNTER — Other Ambulatory Visit (HOSPITAL_COMMUNITY)
Admission: RE | Admit: 2020-02-20 | Discharge: 2020-02-20 | Disposition: A | Discharge status: Home / Self Care | Payer: BC Managed Care – PPO | Source: Ambulatory Visit | Attending: Obstetrics and Gynecology | Admitting: Obstetrics and Gynecology

## 2020-02-20 ENCOUNTER — Other Ambulatory Visit: Payer: Self-pay

## 2020-02-20 ENCOUNTER — Encounter: Payer: Self-pay | Admitting: Obstetrics and Gynecology

## 2020-02-20 VITALS — BP 138/90 | HR 82 | Wt 151.0 lb

## 2020-02-20 DIAGNOSIS — Z3481 Encounter for supervision of other normal pregnancy, first trimester: Secondary | ICD-10-CM | POA: Diagnosis not present

## 2020-02-20 DIAGNOSIS — O99891 Other specified diseases and conditions complicating pregnancy: Secondary | ICD-10-CM

## 2020-02-20 DIAGNOSIS — R030 Elevated blood-pressure reading, without diagnosis of hypertension: Secondary | ICD-10-CM

## 2020-02-20 DIAGNOSIS — B009 Herpesviral infection, unspecified: Secondary | ICD-10-CM

## 2020-02-20 DIAGNOSIS — O98511 Other viral diseases complicating pregnancy, first trimester: Secondary | ICD-10-CM

## 2020-02-20 DIAGNOSIS — Z3A09 9 weeks gestation of pregnancy: Secondary | ICD-10-CM

## 2020-02-20 DIAGNOSIS — Z3490 Encounter for supervision of normal pregnancy, unspecified, unspecified trimester: Secondary | ICD-10-CM | POA: Insufficient documentation

## 2020-02-20 NOTE — Progress Notes (Signed)
Bedside U/S shows single IUP with FHT of 152 BPM and CRL measures 27.57mm  GA is [redacted]w[redacted]d.  Pt did do Femara and HCG with intercourse.

## 2020-02-20 NOTE — Progress Notes (Signed)
INITIAL PRENATAL VISIT NOTE  Subjective:  Maria [name] is a 39 y.o. G2P0010 at [redacted]w[redacted]d by LMP being seen today for her initial prenatal visit. This is a planned pregnancy. She and partner are happy with the pregnancy. She was using nothing for birth control previously. She has an obstetric history significant for n/a. She has a medical history significant for infertility, pregnancy with Femara.  Patient reports fatigue.   . Vag. Bleeding: None.   . Denies leaking of fluid.    Past Medical History:  Diagnosis Date   Depression    Endometriosis 1/12   Laps   Herpes    Takes Valtrex daily; no history of outbreak   History of chlamydia    Infertility, female    Reflux     Past Surgical History:  Procedure Laterality Date   CHOLECYSTECTOMY  2007   LAPAROSCOPY  1/12   Rt ov cyst and endometriosis    OB History  Gravida Para Term Preterm AB Living  2 0 0   1    SAB TAB Ectopic Multiple Live Births  1 0          # Outcome Date GA Lbr Len/2nd Weight Sex Delivery Anes PTL Lv  2 Current           1 SAB             Social History   Socioeconomic History   Marital status: Married    Spouse name: Not on file   Number of children: Not on file   Years of education: Not on file   Highest education level: Not on file  Occupational History   Occupation: banker    Employer: [employer]  Tobacco Use   Smoking status: Former Smoker    Packs/day: 0.25    Years: 1.00    Pack years: 0.25    Types: Cigarettes   Smokeless tobacco: Never Used  Building services engineer Use: Never used  Substance and Sexual Activity   Alcohol use: Yes    Alcohol/week: 1.0 standard drink    Types: 1 Standard drinks or equivalent per week   Drug use: No   Sexual activity: Yes    Partners: Male    Birth control/protection: None  Other Topics Concern   Not on file  Social History Narrative   Not on file   Social Determinants of Health   Financial Resource Strain:     Difficulty of Paying Living Expenses:   Food Insecurity:    Worried About Programme researcher, broadcasting/film/video in the Last Year:    Barista in the Last Year:   Transportation Needs:    Freight forwarder (Medical):    Lack of Transportation (Non-Medical):   Physical Activity:    Days of Exercise per Week:    Minutes of Exercise per Session:   Stress:    Feeling of Stress :   Social Connections:    Frequency of Communication with Friends and Family:    Frequency of Social Gatherings with Friends and Family:    Attends Religious Services:    Active Member of Clubs or Organizations:    Attends Engineer, structural:    Marital Status:     Family History  Problem Relation Age of Onset   Cancer Father        kidney and lung   Kidney disease Mother    Heart disease Mother    Hypertension Mother  Alcohol abuse Mother        recovered   Heart disease Maternal Grandmother    Heart disease Maternal Grandfather      Current Outpatient Medications:    Prenatal Vit-Fe Fumarate-FA (PRENATAL VITAMIN PO), Take by mouth., Disp: , Rfl:    zolpidem (AMBIEN) 10 MG tablet, Take 10 mg by mouth at bedtime as needed for sleep., Disp: , Rfl:   No Known Allergies  Review of Systems: Negative except for what is mentioned in HPI.  Objective:   Vitals:   02/20/20 1428  BP: 138/90  Pulse: 82  Weight: 151 lb (68.5 kg)    Fetal Status: Fetal Heart Rate (bpm): 156         Physical Exam: BP 138/90    Pulse 82    Wt 151 lb (68.5 kg)    LMP 12/18/2019    BMI 27.62 kg/m  CONSTITUTIONAL: Well-developed, well-nourished female in no acute distress.  NEUROLOGIC: Alert and oriented to person, place, and time. Normal reflexes, muscle tone coordination. No cranial nerve deficit noted. PSYCHIATRIC: Normal mood and affect. Normal behavior. Normal judgment and thought content. SKIN: Skin is warm and dry. No rash noted. Not diaphoretic. No erythema. No pallor. HENT:   Normocephalic, atraumatic, External right and left ear normal. Oropharynx is clear and moist EYES: Conjunctivae and EOM are normal. Pupils are equal, round, and reactive to light. No scleral icterus.  NECK: Normal range of motion, supple, no masses CARDIOVASCULAR: Normal heart rate noted, regular rhythm RESPIRATORY: Effort and breath sounds normal, no problems with respiration noted BREASTS: symmetric, non-tender, no masses palpable ABDOMEN: Soft, nontender, nondistended, gravid. GU: normal appearing external female genitalia, nulliparous normal appearing cervix, scant white discharge in vagina, no lesions noted Bimanual: 010 weeks sized uterus, no adnexal tenderness or palpable lesions noted MUSCULOSKELETAL: Normal range of motion. EXT:  No edema and no tenderness. 2+ distal pulses.   Assessment and Plan:  Pregnancy: G2P0010 at [redacted]w[redacted]d by LMP  1. Encounter for supervision of other normal pregnancy in first trimester Reviewed [facility] [employer] structure, multiple providers, fellows, medical students, virtual visits, MyChart.  - Cervicovaginal ancillary only( [facility]) - Obstetric panel - HIV antibody (with reflex) - Hepatitis C Antibody - Babyscripts Schedule Optimization - Culture, OB Urine  2. Herpes Outbreaks on back, none on genitals Would prefer to take ppx  3. Elevated BP without diagnosis of hypertension Several elevated diastolics  Monitor closely ? cHTN  Preterm labor symptoms and general obstetric precautions including but not limited to vaginal bleeding, contractions, leaking of fluid and fetal movement were reviewed in detail with the patient.  Please refer to After Visit Summary for other counseling recommendations.   Return in about 4 weeks (around 03/19/2020) for low OB, in person.  Conan Bowens 02/20/2020 4:55 PM

## 2020-02-21 LAB — HEPATITIS C ANTIBODY
Hepatitis C Ab: NONREACTIVE
SIGNAL TO CUT-OFF: 0.01 (ref <1.00)

## 2020-02-21 LAB — CERVICOVAGINAL ANCILLARY ONLY
Chlamydia: NEGATIVE
Comment: NEGATIVE
Comment: NORMAL
Neisseria Gonorrhea: NEGATIVE

## 2020-02-21 LAB — OBSTETRIC PANEL
Absolute Monocytes: 607 cells/uL (ref 200–950)
Antibody Screen: NOT DETECTED
Basophils Absolute: 37 cells/uL (ref 0–200)
Basophils Relative: 0.5 %
Eosinophils Absolute: 163 cells/uL (ref 15–500)
Eosinophils Relative: 2.2 %
HCT: 40.1 % (ref 35.0–45.0)
Hemoglobin: 13 g/dL (ref 11.7–15.5)
Hepatitis B Surface Ag: NONREACTIVE
Lymphs Abs: 1946 cells/uL (ref 850–3900)
MCH: 31.4 pg (ref 27.0–33.0)
MCHC: 32.4 g/dL (ref 32.0–36.0)
MCV: 96.9 fL (ref 80.0–100.0)
MPV: 9.7 fL (ref 7.5–12.5)
Monocytes Relative: 8.2 %
Neutro Abs: 4647 cells/uL (ref 1500–7800)
Neutrophils Relative %: 62.8 %
Platelets: 275 10*3/uL (ref 140–400)
RBC: 4.14 10*6/uL (ref 3.80–5.10)
RDW: 11.8 % (ref 11.0–15.0)
RPR Ser Ql: NONREACTIVE
Rubella: 18.3 Index
Total Lymphocyte: 26.3 %
WBC: 7.4 10*3/uL (ref 3.8–10.8)

## 2020-02-21 LAB — HIV ANTIBODY (ROUTINE TESTING W REFLEX): HIV 1&2 Ab, 4th Generation: NONREACTIVE

## 2020-02-22 LAB — URINE CULTURE, OB REFLEX

## 2020-02-22 LAB — CULTURE, OB URINE

## 2020-03-13 ENCOUNTER — Ambulatory Visit (INDEPENDENT_AMBULATORY_CARE_PROVIDER_SITE_OTHER): Payer: BC Managed Care – PPO

## 2020-03-13 ENCOUNTER — Other Ambulatory Visit: Payer: Self-pay

## 2020-03-13 DIAGNOSIS — Z3481 Encounter for supervision of other normal pregnancy, first trimester: Secondary | ICD-10-CM

## 2020-03-13 DIAGNOSIS — B009 Herpesviral infection, unspecified: Secondary | ICD-10-CM

## 2020-03-13 DIAGNOSIS — O09519 Supervision of elderly primigravida, unspecified trimester: Secondary | ICD-10-CM | POA: Insufficient documentation

## 2020-03-13 DIAGNOSIS — O98311 Other infections with a predominantly sexual mode of transmission complicating pregnancy, first trimester: Secondary | ICD-10-CM

## 2020-03-13 DIAGNOSIS — Z3A12 12 weeks gestation of pregnancy: Secondary | ICD-10-CM

## 2020-03-13 DIAGNOSIS — O09511 Supervision of elderly primigravida, first trimester: Secondary | ICD-10-CM

## 2020-03-13 DIAGNOSIS — O09521 Supervision of elderly multigravida, first trimester: Secondary | ICD-10-CM

## 2020-03-13 DIAGNOSIS — N809 Endometriosis, unspecified: Secondary | ICD-10-CM

## 2020-03-13 MED ORDER — VALACYCLOVIR HCL 1 G PO TABS: 1000.0000 mg | ORAL_TABLET | Freq: Every day | ORAL | 1 refills | Status: DC

## 2020-03-13 NOTE — Patient Instructions (Signed)

## 2020-03-13 NOTE — Addendum Note (Signed)
Addended by: Rolm Bookbinder on: 03/13/2020 10:35 AM   Modules accepted: Orders

## 2020-03-13 NOTE — Progress Notes (Addendum)
   PRENATAL VISIT NOTE  Subjective:  Maria [name] is a 39 y.o. G2P0010 at [redacted]w[redacted]d being seen today for ongoing prenatal care.  She is currently monitored for the following issues for this low-risk pregnancy and has Endometriosis; Bacterial vaginosis; HPV test positive; Supervision of normal pregnancy; and Herpes on their problem list.  Patient reports no complaints.   . Vag. Bleeding: None.  Movement: Absent. Denies leaking of fluid.   The following portions of the patient's history were reviewed and updated as appropriate: allergies, current medications, past family history, past medical history, past social history, past surgical history and problem list.   Objective:   Vitals:   03/13/20 0957  BP: 114/80  Pulse: 80  Weight: 151 lb (68.5 kg)    Fetal Status: Fetal Heart Rate (bpm): 159   Movement: Absent     General:  Alert, oriented and cooperative. Patient is in no acute distress.  Skin: Skin is warm and dry. No rash noted.   Cardiovascular: Normal heart rate noted  Respiratory: Normal respiratory effort, no problems with respiration noted  Abdomen: Soft, gravid, appropriate for gestational age.  Pain/Pressure: Absent     Pelvic: Cervical exam deferred        Extremities: Normal range of motion.  Edema: None  Mental Status: Normal mood and affect. Normal behavior. Normal judgment and thought content.   Assessment and Plan:  Pregnancy: G2P0010 at [redacted]w[redacted]d 1. Encounter for supervision of other normal pregnancy in first trimester - No complaints. Routine care - Desires NIPS. Will draw today.  - Anticipatory guidance of next visits discussed. Will schedule anatomy u/s at next visit.  - Requesting RX for treatment of frequent HSV outbreaks on back.   Preterm labor symptoms and general obstetric precautions including but not limited to vaginal bleeding, contractions, leaking of fluid and fetal movement were reviewed in detail with the patient. Please refer to After Visit Summary for  other counseling recommendations.   Return in about 4 weeks (around 04/10/2020) for Return OB visit.  Rolm Bookbinder, CNM 03/13/20 10:11 AM

## 2020-03-16 ENCOUNTER — Ambulatory Visit (INDEPENDENT_AMBULATORY_CARE_PROVIDER_SITE_OTHER): Payer: BC Managed Care – PPO | Admitting: *Deleted

## 2020-03-16 ENCOUNTER — Other Ambulatory Visit: Payer: Self-pay

## 2020-03-16 DIAGNOSIS — Z3481 Encounter for supervision of other normal pregnancy, first trimester: Secondary | ICD-10-CM | POA: Diagnosis not present

## 2020-03-16 DIAGNOSIS — Z3401 Encounter for supervision of normal first pregnancy, first trimester: Secondary | ICD-10-CM

## 2020-03-16 DIAGNOSIS — Z31430 Encounter of female for testing for genetic disease carrier status for procreative management: Secondary | ICD-10-CM | POA: Diagnosis not present

## 2020-03-16 NOTE — Progress Notes (Signed)
Pt here for [facility] lab only. °

## 2020-03-19 ENCOUNTER — Encounter: Payer: BC Managed Care – PPO | Admitting: Obstetrics & Gynecology

## 2020-03-23 DIAGNOSIS — Z3481 Encounter for supervision of other normal pregnancy, first trimester: Secondary | ICD-10-CM

## 2020-04-06 DIAGNOSIS — F5101 Primary insomnia: Secondary | ICD-10-CM | POA: Diagnosis not present

## 2020-04-06 DIAGNOSIS — Z760 Encounter for issue of repeat prescription: Secondary | ICD-10-CM | POA: Diagnosis not present

## 2020-04-10 ENCOUNTER — Other Ambulatory Visit: Payer: Self-pay

## 2020-04-10 ENCOUNTER — Ambulatory Visit (INDEPENDENT_AMBULATORY_CARE_PROVIDER_SITE_OTHER): Payer: BC Managed Care – PPO | Admitting: Certified Nurse Midwife

## 2020-04-10 VITALS — BP 125/74 | HR 84 | Wt 158.0 lb

## 2020-04-10 DIAGNOSIS — Z7189 Other specified counseling: Secondary | ICD-10-CM | POA: Diagnosis not present

## 2020-04-10 DIAGNOSIS — Z3A16 16 weeks gestation of pregnancy: Secondary | ICD-10-CM

## 2020-04-10 DIAGNOSIS — B009 Herpesviral infection, unspecified: Secondary | ICD-10-CM | POA: Diagnosis not present

## 2020-04-10 DIAGNOSIS — Z3482 Encounter for supervision of other normal pregnancy, second trimester: Secondary | ICD-10-CM | POA: Diagnosis not present

## 2020-04-10 DIAGNOSIS — Z3490 Encounter for supervision of normal pregnancy, unspecified, unspecified trimester: Secondary | ICD-10-CM | POA: Diagnosis not present

## 2020-04-10 NOTE — Progress Notes (Signed)
Subjective:  Maria [name] is a 39 y.o. G2P0010 at [redacted]w[redacted]d being seen today for ongoing prenatal care.  She is currently monitored for the following issues for this low-risk pregnancy and has Endometriosis; Bacterial vaginosis; HPV test positive; Supervision of normal pregnancy; Herpes; and AMA (advanced maternal age) primigravida 35+ on their problem list.  Patient reports no complaints.  Contractions: Not present. Vag. Bleeding: None.  Movement: Absent. Denies leaking of fluid.   The following portions of the patient's history were reviewed and updated as appropriate: allergies, current medications, past family history, past medical history, past social history, past surgical history and problem list. Problem list updated.  Objective:   Vitals:   04/10/20 1000  BP: 125/74  Pulse: 84  Weight: 158 lb (71.7 kg)    Fetal Status: Fetal Heart Rate (bpm): 154   Movement: Absent     General:  Alert, oriented and cooperative. Patient is in no acute distress.  Skin: Skin is warm and dry. No rash noted.   Cardiovascular: Normal heart rate noted  Respiratory: Normal respiratory effort, no problems with respiration noted  Abdomen: Soft, gravid, appropriate for gestational age. Pain/Pressure: Absent     Pelvic: Vag. Bleeding: None Vag D/C Character: Thin   Cervical exam deferred        Extremities: Normal range of motion.  Edema: None  Mental Status: Normal mood and affect. Normal behavior. Normal judgment and thought content.   Urinalysis:      Assessment and Plan:  Pregnancy: G2P0010 at [redacted]w[redacted]d  1. [redacted] weeks gestation of pregnancy - Alpha fetoprotein, maternal  2. Encounter for supervision of other normal pregnancy in second trimester - [place] MFM OB COMP + 14 WK; Future - The patient was counseled on the potential benefits and lack of known risks of COVID vaccination, during pregnancy and breastfeeding, on today's visit. The patient's questions and concerns were addressed today. Reports hx of  Covid earlier this year, was symptomatic. The patient is not planning to get vaccinated at this time.   3. Herpes - Hx of possible HSV on lower back in past- had culture once but not positive for HSV - seropositive IgG for HSV but never had genital or oral outbreak - will culture if has another outbreak - consider suppression at 35 weeks discussed  Preterm labor symptoms and general obstetric precautions including but not limited to vaginal bleeding, contractions, leaking of fluid and fetal movement were reviewed in detail with the patient. Please refer to After Visit Summary for other counseling recommendations.  Return in about 4 weeks (around 05/08/2020).   Donette Larry, CNM

## 2020-04-10 NOTE — Progress Notes (Signed)
AFP drawn

## 2020-04-13 LAB — ALPHA FETOPROTEIN, MATERNAL
AFP MoM: 1.14
AFP, Serum: 39.5 ng/mL
Calc'd Gestational Age: 16.3 weeks
Maternal Wt: 152 [lb_av]
Risk for ONTD: <1
Twins-AFP: 1

## 2020-04-14 ENCOUNTER — Other Ambulatory Visit: Payer: Self-pay

## 2020-04-14 ENCOUNTER — Ambulatory Visit (INDEPENDENT_AMBULATORY_CARE_PROVIDER_SITE_OTHER): Payer: BC Managed Care – PPO | Admitting: Advanced Practice Midwife

## 2020-04-14 ENCOUNTER — Other Ambulatory Visit (HOSPITAL_COMMUNITY)
Admission: RE | Admit: 2020-04-14 | Discharge: 2020-04-14 | Disposition: A | Discharge status: Home / Self Care | Payer: BC Managed Care – PPO | Source: Ambulatory Visit | Attending: Advanced Practice Midwife | Admitting: Advanced Practice Midwife

## 2020-04-14 VITALS — BP 110/65 | HR 78 | Temp 98.1°F | Wt 159.0 lb

## 2020-04-14 DIAGNOSIS — N760 Acute vaginitis: Secondary | ICD-10-CM

## 2020-04-14 DIAGNOSIS — O23592 Infection of other part of genital tract in pregnancy, second trimester: Secondary | ICD-10-CM | POA: Insufficient documentation

## 2020-04-14 DIAGNOSIS — Z3A16 16 weeks gestation of pregnancy: Secondary | ICD-10-CM | POA: Insufficient documentation

## 2020-04-14 DIAGNOSIS — O4692 Antepartum hemorrhage, unspecified, second trimester: Secondary | ICD-10-CM

## 2020-04-14 DIAGNOSIS — R319 Hematuria, unspecified: Secondary | ICD-10-CM | POA: Diagnosis not present

## 2020-04-14 DIAGNOSIS — B9689 Other specified bacterial agents as the cause of diseases classified elsewhere: Secondary | ICD-10-CM | POA: Insufficient documentation

## 2020-04-14 LAB — POCT URINALYSIS DIPSTICK
Bilirubin, UA: NEGATIVE
Glucose, UA: NEGATIVE
Ketones, UA: NEGATIVE
Nitrite, UA: NEGATIVE
Protein, UA: NEGATIVE
Spec Grav, UA: <=1.005 — AB (ref 1.010–1.025)
Urobilinogen, UA: NEGATIVE E.U./dL — AB
pH, UA: 7.5 (ref 5.0–8.0)

## 2020-04-14 NOTE — Patient Instructions (Signed)
Vaginal Bleeding During Pregnancy, Second Trimester ° °A small amount of bleeding (spotting) from the vagina is relatively common during pregnancy. It usually stops on its own. Various things can cause spotting during pregnancy. Sometimes the bleeding is normal and is not a sign of a problem in the pregnancy. However, bleeding can also be a sign of something serious. Be sure to tell your health care provider about any vaginal bleeding right away. °Some possible causes of vaginal bleeding during the second trimester include: °· Infection, inflammation, or growths (polyps) on the cervix. °· A condition in which the placenta partially or completely covers the opening of the cervix inside the uterus (placenta previa). °· The placenta separating from the uterus (placenta abruption). °· Early (preterm) labor. °· The cervix opening and thinning before pregnancy is at term and before labor starts (cervical insufficiency). °· A mass of tissue developing in the uterus due to an egg being fertilized incorrectly (molar pregnancy). °Follow these instructions at home: °Activity °· Follow instructions from your health care provider about limiting your activity. Ask what activities are safe for you. °· If needed, make plans for someone to help with your regular activities. °· Do not exercise or do activities that take a lot of effort unless your health care provider approves. °· Do not lift anything that is heavier than 10 lb (4.5 kg), or the limit that your health care provider tells you, until he or she says that it is safe. °· Do not have sex or orgasms until your health care provider says that this is safe. °Medicines °· Take over-the-counter and prescription medicines only as told by your health care provider. °· Do not take aspirin because it can cause bleeding. °General instructions °· Pay attention to any changes in your symptoms. °· Write down how many pads you use each day, how often you change pads, and how soaked  (saturated) they are. °· Do not use tampons or douche. °· If you pass any tissue from your vagina, save the tissue so you can show it to your health care provider. °· Keep all follow-up visits as told by your health care provider. This is important. °Contact a health care provider if: °· You have vaginal bleeding during any time of your pregnancy. °· You have cramps or labor pains. °· You have a fever that does not get better when you take medicines. °Get help right away if: °· You have severe cramps in your back or abdomen. °· You have contractions. °· You have chills. °· You pass large clots or a large amount of tissue from your vagina. °· Your bleeding increases. °· You feel light-headed or weak, or you faint. °· You are leaking fluid or have a gush of fluid from your vagina. °Summary °· Various things can cause bleeding or spotting in pregnancy. °· Be sure to tell your health care provider about any vaginal bleeding right away. °· Follow instructions from your health care provider about limiting your activity. Ask what activities are safe for you. °This information is not intended to replace advice given to you by your health care provider. Make sure you discuss any questions you have with your health care provider. °Document Revised: 11/20/2018 Document Reviewed: 11/03/2016 °Elsevier Patient Education © 2020 [employer] ° °

## 2020-04-14 NOTE — Progress Notes (Signed)
   PRENATAL VISIT NOTE  Subjective:  Maria [name] is a 39 y.o. G2P0010 at [redacted]w[redacted]d being seen today for ongoing prenatal care.  She is currently monitored for the following issues for this low-risk pregnancy and has Endometriosis; Bacterial vaginosis; HPV test positive; Supervision of normal pregnancy; Herpes; and AMA (advanced maternal age) primigravida 35+ on their problem list.  Patient reports bleeding.  Contractions: Not present. Vag. Bleeding: Scant, Other.  Movement: Absent. Denies leaking of fluid.   The following portions of the patient's history were reviewed and updated as appropriate: allergies, current medications, past family history, past medical history, past social history, past surgical history and problem list.   Objective:   Vitals:   04/14/20 1041  BP: 110/65  Pulse: 78  Temp: 98.1 F (36.7 C)  Weight: 159 lb (72.1 kg)    Fetal Status: Fetal Heart Rate (bpm): 157   Movement: Absent     General:  Alert, oriented and cooperative. Patient is in no acute distress.  Skin: Skin is warm and dry. No rash noted.   Cardiovascular: Normal heart rate noted  Respiratory: Normal respiratory effort, no problems with respiration noted  Abdomen: Soft, gravid, appropriate for gestational age.  Pain/Pressure: Absent     Pelvic: Cervical exam performed in the presence of a chaperone Dilation: Closed Effacement (%): 0 Station: Ballotable  Extremities: Normal range of motion.  Edema: None  Mental Status: Normal mood and affect. Normal behavior. Normal judgment and thought content.   Assessment and Plan:  Pregnancy: G2P0010 at [redacted]w[redacted]d 1. Hematuria, unspecified type  - POCT Urinalysis Dipstick - Culture, OB Urine  2. Vaginal bleeding in pregnancy, second trimester --Cervix closed/thick/high, visually with some irritation on surface of cervix and slightly friable to cotton swab --Likely cervical bleeding, no known triggers, no recent intercourse or exercise --No active bleeding on  exam --Bleeding precautions/reasons to seek care reviewed - Cervicovaginal ancillary only( [place])   Preterm labor symptoms and general obstetric precautions including but not limited to vaginal bleeding, contractions, leaking of fluid and fetal movement were reviewed in detail with the patient. Please refer to After Visit Summary for other counseling recommendations.   No follow-ups on file.  Future Appointments  Date Time Provider Department Center  04/29/2020  2:45 PM [facility]  05/07/2020  9:00 AM Anyanwu, Jethro Bastos, MD [facility]    Sharen Counter, CNM

## 2020-04-15 LAB — CERVICOVAGINAL ANCILLARY ONLY
Bacterial Vaginitis (gardnerella): POSITIVE — AB
Candida Glabrata: NEGATIVE
Candida Vaginitis: NEGATIVE
Comment: NEGATIVE
Comment: NEGATIVE
Comment: NEGATIVE

## 2020-04-15 MED ORDER — METRONIDAZOLE 500 MG PO TABS: 500.0000 mg | ORAL_TABLET | Freq: Two times a day (BID) | ORAL | 0 refills | Status: AC

## 2020-04-15 NOTE — Addendum Note (Signed)
Addended by: Sharen Counter A on: 04/15/2020 01:33 PM   Modules accepted: Orders

## 2020-04-16 LAB — CULTURE, OB URINE

## 2020-04-16 LAB — URINE CULTURE, OB REFLEX: Organism ID, Bacteria: NO GROWTH

## 2020-04-29 ENCOUNTER — Ambulatory Visit: Payer: BC Managed Care – PPO

## 2020-04-30 ENCOUNTER — Other Ambulatory Visit: Payer: Self-pay

## 2020-04-30 ENCOUNTER — Other Ambulatory Visit: Payer: Self-pay | Admitting: Certified Nurse Midwife

## 2020-04-30 ENCOUNTER — Ambulatory Visit: Payer: BC Managed Care – PPO | Attending: Certified Nurse Midwife

## 2020-04-30 DIAGNOSIS — Z8279 Family history of other congenital malformations, deformations and chromosomal abnormalities: Secondary | ICD-10-CM | POA: Diagnosis not present

## 2020-04-30 DIAGNOSIS — Z3482 Encounter for supervision of other normal pregnancy, second trimester: Secondary | ICD-10-CM

## 2020-05-01 ENCOUNTER — Other Ambulatory Visit: Payer: Self-pay | Admitting: *Deleted

## 2020-05-01 DIAGNOSIS — Z362 Encounter for other antenatal screening follow-up: Secondary | ICD-10-CM

## 2020-05-07 ENCOUNTER — Telehealth (INDEPENDENT_AMBULATORY_CARE_PROVIDER_SITE_OTHER): Payer: BC Managed Care – PPO | Admitting: Obstetrics & Gynecology

## 2020-05-07 ENCOUNTER — Telehealth: Payer: Self-pay | Admitting: *Deleted

## 2020-05-07 ENCOUNTER — Encounter: Payer: Self-pay | Admitting: Obstetrics & Gynecology

## 2020-05-07 VITALS — BP 130/75 | HR 76 | Wt 160.0 lb

## 2020-05-07 DIAGNOSIS — Z3A20 20 weeks gestation of pregnancy: Secondary | ICD-10-CM

## 2020-05-07 DIAGNOSIS — Z3482 Encounter for supervision of other normal pregnancy, second trimester: Secondary | ICD-10-CM

## 2020-05-07 DIAGNOSIS — Z8279 Family history of other congenital malformations, deformations and chromosomal abnormalities: Secondary | ICD-10-CM

## 2020-05-07 NOTE — Patient Instructions (Signed)
Return to office for any scheduled appointments. Call the office or go to the MAU at [facility] if:  You begin to have strong, frequent contractions  Your water breaks.  Sometimes it is a big gush of fluid, sometimes it is just a trickle that keeps getting your panties wet or running down your legs  You have vaginal bleeding.  It is normal to have a small amount of spotting if your cervix was checked.   You do not feel your baby moving like normal.  If you do not, get something to eat and drink and lay down and focus on feeling your baby move.   If your baby is still not moving like normal, you should call the office or go to MAU.  Any other obstetric concerns.   Second Trimester of Pregnancy The second trimester is from week 14 through week 27 (months 4 through 6). The second trimester is often a time when you feel your best. Your body has adjusted to being pregnant, and you begin to feel better physically. Usually, morning sickness has lessened or quit completely, you may have more energy, and you may have an increase in appetite. The second trimester is also a time when the fetus is growing rapidly. At the end of the sixth month, the fetus is about 9 inches long and weighs about 1 pounds. You will likely begin to feel the baby move (quickening) between 16 and 20 weeks of pregnancy. Body changes during your second trimester Your body continues to go through many changes during your second trimester. The changes vary from woman to woman.  Your weight will continue to increase. You will notice your lower abdomen bulging out.  You may begin to get stretch marks on your hips, abdomen, and breasts.  You may develop headaches that can be relieved by medicines. The medicines should be approved by your health care provider.  You may urinate more often because the fetus is pressing on your bladder.  You may develop or continue to have heartburn as a result of your  pregnancy.  You may develop constipation because certain hormones are causing the muscles that push waste through your intestines to slow down.  You may develop hemorrhoids or swollen, bulging veins (varicose veins).  You may have back pain. This is caused by: ? Weight gain. ? Pregnancy hormones that are relaxing the joints in your pelvis. ? A shift in weight and the muscles that support your balance.  Your breasts will continue to grow and they will continue to become tender.  Your gums may bleed and may be sensitive to brushing and flossing.  Dark spots or blotches (chloasma, mask of pregnancy) may develop on your face. This will likely fade after the baby is born.  A dark line from your belly button to the pubic area (linea nigra) may appear. This will likely fade after the baby is born.  You may have changes in your hair. These can include thickening of your hair, rapid growth, and changes in texture. Some women also have hair loss during or after pregnancy, or hair that feels dry or thin. Your hair will most likely return to normal after your baby is born. What to expect at prenatal visits During a routine prenatal visit:  You will be weighed to make sure you and the fetus are growing normally.  Your blood pressure will be taken.  Your abdomen will be measured to track your baby's growth.    The fetal heartbeat will be listened to.  Any test results from the previous visit will be discussed. Your health care provider may ask you:  How you are feeling.  If you are feeling the baby move.  If you have had any abnormal symptoms, such as leaking fluid, bleeding, severe headaches, or abdominal cramping.  If you are using any tobacco products, including cigarettes, chewing tobacco, and electronic cigarettes.  If you have any questions. Other tests that may be performed during your second trimester include:  Blood tests that check for: ? Low iron levels (anemia). ? High  blood sugar that affects pregnant women (gestational diabetes) between 24 and 28 weeks. ? Rh antibodies. This is to check for a protein on red blood cells (Rh factor).  Urine tests to check for infections, diabetes, or protein in the urine.  An ultrasound to confirm the proper growth and development of the baby.  An amniocentesis to check for possible genetic problems.  Fetal screens for spina bifida and Down syndrome.  HIV (human immunodeficiency virus) testing. Routine prenatal testing includes screening for HIV, unless you choose not to have this test. Follow these instructions at home: Medicines  Follow your health care provider's instructions regarding medicine use. Specific medicines may be either safe or unsafe to take during pregnancy.  Take a prenatal vitamin that contains at least 600 micrograms (mcg) of folic acid.  If you develop constipation, try taking a stool softener if your health care provider approves. Eating and drinking   Eat a balanced diet that includes fresh fruits and vegetables, whole grains, good sources of protein such as meat, eggs, or tofu, and low-fat dairy. Your health care provider will help you determine the amount of weight gain that is right for you.  Avoid raw meat and uncooked cheese. These carry germs that can cause birth defects in the baby.  If you have low calcium intake from food, talk to your health care provider about whether you should take a daily calcium supplement.  Limit foods that are high in fat and processed sugars, such as fried and sweet foods.  To prevent constipation: ? Drink enough fluid to keep your urine clear or pale yellow. ? Eat foods that are high in fiber, such as fresh fruits and vegetables, whole grains, and beans. Activity  Exercise only as directed by your health care provider. Most women can continue their usual exercise routine during pregnancy. Try to exercise for 30 minutes at least 5 days a week. Stop  exercising if you experience uterine contractions.  Avoid heavy lifting, wear low heel shoes, and practice good posture.  A sexual relationship may be continued unless your health care provider directs you otherwise. Relieving pain and discomfort  Wear a good support bra to prevent discomfort from breast tenderness.  Take warm sitz baths to soothe any pain or discomfort caused by hemorrhoids. Use hemorrhoid cream if your health care provider approves.  Rest with your legs elevated if you have leg cramps or low back pain.  If you develop varicose veins, wear support hose. Elevate your feet for 15 minutes, 3-4 times a day. Limit salt in your diet. Prenatal Care  Write down your questions. Take them to your prenatal visits.  Keep all your prenatal visits as told by your health care provider. This is important. Safety  Wear your seat belt at all times when driving.  Make a list of emergency phone numbers, including numbers for family, friends, the hospital, and police and   Garment/textile technologist. General instructions  Ask your health care provider for a referral to a local prenatal education class. Begin classes no later than the beginning of month 6 of your pregnancy.  Ask for help if you have counseling or nutritional needs during pregnancy. Your health care provider can offer advice or refer you to specialists for help with various needs.  Do not use hot tubs, steam rooms, or saunas.  Do not douche or use tampons or scented sanitary pads.  Do not cross your legs for long periods of time.  Avoid cat litter boxes and soil used by cats. These carry germs that can cause birth defects in the baby and possibly loss of the fetus by miscarriage or stillbirth.  Avoid all smoking, herbs, alcohol, and unprescribed drugs. Chemicals in these products can affect the formation and growth of the baby.  Do not use any products that contain nicotine or tobacco, such as cigarettes and e-cigarettes. If  you need help quitting, ask your health care provider.  Visit your dentist if you have not gone yet during your pregnancy. Use a soft toothbrush to brush your teeth and be gentle when you floss. Contact a health care provider if:  You have dizziness.  You have mild pelvic cramps, pelvic pressure, or nagging pain in the abdominal area.  You have persistent nausea, vomiting, or diarrhea.  You have a bad smelling vaginal discharge.  You have pain when you urinate. Get help right away if:  You have a fever.  You are leaking fluid from your vagina.  You have spotting or bleeding from your vagina.  You have severe abdominal cramping or pain.  You have rapid weight gain or weight loss.  You have shortness of breath with chest pain.  You notice sudden or extreme swelling of your face, hands, ankles, feet, or legs.  You have not felt your baby move in over an hour.  You have severe headaches that do not go away when you take medicine.  You have vision changes. Summary  The second trimester is from week 14 through week 27 (months 4 through 6). It is also a time when the fetus is growing rapidly.  Your body goes through many changes during pregnancy. The changes vary from woman to woman.  Avoid all smoking, herbs, alcohol, and unprescribed drugs. These chemicals affect the formation and growth your baby.  Do not use any tobacco products, such as cigarettes, chewing tobacco, and e-cigarettes. If you need help quitting, ask your health care provider.  Contact your health care provider if you have any questions. Keep all prenatal visits as told by your health care provider. This is important. This information is not intended to replace advice given to you by your health care provider. Make sure you discuss any questions you have with your health care provider. Document Revised: 11/23/2018 Document Reviewed: 09/06/2016 Elsevier Patient Education  2020 [employer].

## 2020-05-07 NOTE — Progress Notes (Signed)
OBSTETRICS PRENATAL VIRTUAL VISIT ENCOUNTER NOTE  Provider location: [facility] [place]   I connected with Maria [name] on 05/07/20 at  9:00 AM EDT by MyChart Video Encounter at home and verified that I am speaking with the correct person using two identifiers.   I discussed the limitations, risks, security and privacy concerns of performing an evaluation and management service virtually and the availability of in person appointments. I also discussed with the patient that there may be a patient responsible charge related to this service. The patient expressed understanding and agreed to proceed. Subjective:  SOJOURNER BEHRINGER is a 39 y.o. G2P0010 at [redacted]w[redacted]d being seen today for ongoing prenatal care.  She is currently monitored for the following issues for this low-risk pregnancy and has Endometriosis; Bacterial vaginosis; HPV test positive; Supervision of normal pregnancy; Herpes; AMA (advanced maternal age) primigravida 68+; and Family history of congenital heart defect on their problem list.  Patient reports no complaints.  Contractions: Not present. Vag. Bleeding: None.  Movement: Present. Denies any leaking of fluid.   The following portions of the patient's history were reviewed and updated as appropriate: allergies, current medications, past family history, past medical history, past social history, past surgical history and problem list.   Objective:   Vitals:   05/07/20 0751  BP: 130/75  Pulse: 76  Weight: 160 lb (72.6 kg)    Fetal Status:     Movement: Present     General:  Alert, oriented and cooperative. Patient is in no acute distress.  Respiratory: Normal respiratory effort, no problems with respiration noted  Mental Status: Normal mood and affect. Normal behavior. Normal judgment and thought content.  Rest of physical exam deferred due to type of encounter  Imaging: [place] MFM OB DETAIL +14 WK  Result Date:  04/30/2020 ----------------------------------------------------------------------  OBSTETRICS REPORT                       (Signed Final 04/30/2020 03:56 pm) ---------------------------------------------------------------------- Patient Info  ID #:       [id number]                          D.O.B.:  08-24-1980 (38 yrs)  Name:       Maria [name]                  Visit Date: 04/30/2020 03:46 pm ---------------------------------------------------------------------- Performed By  Attending:        Lin Landsman      Ref. Address:     [address]                    MD                                                             [address]                                                             [place], [place]  [address]  Performed By:     Marcellina Millin          [facility]  Referred By:      Lawernce Pitts CNM ---------------------------------------------------------------------- Orders  #  Description                           Code        Ordered By  1  [place] MFM OB DETAIL +14 WK               76811.01    [facility] ----------------------------------------------------------------------  #  Order #                     Accession #                Episode #  1  [id number]                   [id number]                 [id number] ---------------------------------------------------------------------- Indications  [redacted] weeks gestation of pregnancy                Z3A.29  Advanced maternal age primigravida 20+,        O67.512  second trimester  Family history of congenital anomaly (FOB-     Z82.79  Bicuspid aortic valve)  Encounter for antenatal screening for          Z36.3  malformations  Low Risk Nips  ---------------------------------------------------------------------- Fetal Evaluation  Num Of Fetuses:         1  Fetal Heart Rate(bpm):  175  Cardiac Activity:       Observed  Presentation:           Breech  Placenta:               Anterior  P. Cord Insertion:      Visualized  Amniotic Fluid  AFI FV:      Within normal limits ---------------------------------------------------------------------- Biometry  BPD:      43.4  mm     G. Age:  19w 1d         50  %  CI:        70.71   %    70 - 86                                                          FL/HC:      16.8   %    16.1 - 18.3  HC:      164.5  mm     G. Age:  19w 1d         44  %    HC/AC:      1.09        1.09 - 1.39  AC:      150.3  mm     G. Age:  20w 2d         81  %    FL/BPD:     63.6   %  FL:       27.6  mm     G. Age:  18w 3d         19  %    FL/AC:      18.4   %    20 - 24  HUM:      27.4  mm     G. Age:  18w 5d         40  %  CER:      18.9  mm     G. Age:  18w 4d         16  %  Est. FW:     290  gm    0 lb 10 oz      61  % ---------------------------------------------------------------------- OB History  Gravidity:    2         Term:   0        Prem:   0        SAB:   1  TOP:          0       Ectopic:  0        Living: 0 ---------------------------------------------------------------------- Gestational Age  LMP:           19w 1d        Date:  12/18/19                 EDD:   09/23/20  U/S Today:     19w 2d                                        EDD:   09/22/20  Best:          19w 1d     Det. By:  LMP  (12/18/19)          EDD:   09/23/20 ---------------------------------------------------------------------- Anatomy  Cranium:               Appears normal         Aortic Arch:            Not well visualized  Cavum:                 Appears normal         Ductal Arch:  Appears normal  Ventricles:            Appears normal         Diaphragm:              Appears normal  Choroid Plexus:        Appears normal         Stomach:                 Appears normal, left                                                                        sided  Cerebellum:            Appears normal         Abdomen:                Appears normal  Posterior Fossa:       Appears normal         Abdominal Wall:         Appears nml (cord                                                                        insert, abd wall)  Nuchal Fold:           Appears normal         Cord Vessels:           Appears normal (3                                                                        vessel cord)  Face:                  Appears normal         Kidneys:                Appear normal                         (orbits and profile)  Lips:                  Appears normal         Bladder:                Appears normal  Thoracic:              Appears normal         Spine:                  Not well visualized  Heart:                 Appears normal  Upper Extremities:      Appears normal                         (4CH, axis, and                         situs)  RVOT:                  Appears normal         Lower Extremities:      Appears normal  LVOT:                  Appears normal  Other:  Fetus appears to be female. Heels and 5th digit visualized. Nasal          bone visualized. 3VV and 3VTV visualized. ---------------------------------------------------------------------- Cervix Uterus Adnexa  Cervix  Length:           3.75  cm.  Normal appearance by transabdominal scan.  Uterus  No abnormality visualized.  Right Ovary  Within normal limits.  Left Ovary  Within normal limits.  Cul De Sac  No free fluid seen.  Adnexa  No abnormality visualized. ---------------------------------------------------------------------- Impression  Single intrauterine pregnancy here for a detailed anatomy  due AMA and FOB with biscuspid aortic valve.  Normal anatomy with measurements consistent with dates  There is good fetal movement and amniotic fluid volume  Suboptimal views of the fetal spine was obtained  secondary  to fetal position.  Father of baby has a known bicuspid aortic valve that  required replacement. Given this we have scheduled Ms. Sweatman  for a fetal echocardiogram. ---------------------------------------------------------------------- Recommendations  Follow up in 4 weeks to complete the fetal anatomy.  Fetal echocardiogram referral was sent. ----------------------------------------------------------------------               Lin Landsman, MD Electronically Signed Final Report   04/30/2020 03:56 pm ----------------------------------------------------------------------   Assessment and Plan:  Pregnancy: G2P0010 at [redacted]w[redacted]d 1. Family history of congenital heart defect No abnormalities on ultrasound, fetal ECHO scheduled.   2. [redacted] weeks gestation of pregnancy 3. Encounter for supervision of other normal pregnancy in second trimester Low risk NIPS, negative MSAFP.  Follow up anatomy scan scheduled to evaluate spine. Preterm labor symptoms and general obstetric precautions including but not limited to vaginal bleeding, contractions, leaking of fluid and fetal movement were reviewed in detail with the patient. I discussed the assessment and treatment plan with the patient. The patient was provided an opportunity to ask questions and all were answered. The patient agreed with the plan and demonstrated an understanding of the instructions. The patient was advised to call back or seek an in-person office evaluation/go to MAU at [facility] for any urgent or concerning symptoms. Please refer to After Visit Summary for other counseling recommendations.   I provided 10 minutes of face-to-face time during this encounter.  Return in about 4 weeks (around 06/04/2020) for Virtual OB Visit.   Jaynie Collins, MD [facility] [employer], [facility]

## 2020-05-07 NOTE — Telephone Encounter (Signed)
Left patient a message to call and schedule 4 week virtual OB appointment. Schedule with midwife if possible, patient is not a high risk OB.

## 2020-05-08 ENCOUNTER — Telehealth: Payer: BC Managed Care – PPO | Admitting: Obstetrics and Gynecology

## 2020-05-25 ENCOUNTER — Encounter: Payer: Self-pay | Admitting: Maternal & Fetal Medicine

## 2020-05-25 DIAGNOSIS — O352XX0 Maternal care for (suspected) hereditary disease in fetus, not applicable or unspecified: Secondary | ICD-10-CM | POA: Diagnosis not present

## 2020-05-25 DIAGNOSIS — Z8279 Family history of other congenital malformations, deformations and chromosomal abnormalities: Secondary | ICD-10-CM | POA: Diagnosis not present

## 2020-05-25 DIAGNOSIS — Z3A22 22 weeks gestation of pregnancy: Secondary | ICD-10-CM | POA: Diagnosis not present

## 2020-05-28 ENCOUNTER — Ambulatory Visit: Payer: BC Managed Care – PPO | Attending: Maternal & Fetal Medicine

## 2020-05-28 ENCOUNTER — Ambulatory Visit: Payer: BC Managed Care – PPO | Admitting: *Deleted

## 2020-05-28 ENCOUNTER — Other Ambulatory Visit: Payer: Self-pay

## 2020-05-28 DIAGNOSIS — Z8279 Family history of other congenital malformations, deformations and chromosomal abnormalities: Secondary | ICD-10-CM

## 2020-05-28 DIAGNOSIS — Z3A23 23 weeks gestation of pregnancy: Secondary | ICD-10-CM

## 2020-05-28 DIAGNOSIS — Z362 Encounter for other antenatal screening follow-up: Secondary | ICD-10-CM | POA: Diagnosis not present

## 2020-05-28 DIAGNOSIS — O09512 Supervision of elderly primigravida, second trimester: Secondary | ICD-10-CM | POA: Diagnosis not present

## 2020-05-29 ENCOUNTER — Other Ambulatory Visit: Payer: Self-pay | Admitting: *Deleted

## 2020-05-29 DIAGNOSIS — O09523 Supervision of elderly multigravida, third trimester: Secondary | ICD-10-CM

## 2020-06-05 ENCOUNTER — Other Ambulatory Visit: Payer: Self-pay

## 2020-06-05 ENCOUNTER — Telehealth (INDEPENDENT_AMBULATORY_CARE_PROVIDER_SITE_OTHER): Payer: BC Managed Care – PPO | Admitting: Certified Nurse Midwife

## 2020-06-05 VITALS — BP 115/78 | HR 72 | Wt 167.0 lb

## 2020-06-05 DIAGNOSIS — B009 Herpesviral infection, unspecified: Secondary | ICD-10-CM

## 2020-06-05 DIAGNOSIS — Z8279 Family history of other congenital malformations, deformations and chromosomal abnormalities: Secondary | ICD-10-CM

## 2020-06-05 DIAGNOSIS — Z3A24 24 weeks gestation of pregnancy: Secondary | ICD-10-CM

## 2020-06-05 DIAGNOSIS — Z3482 Encounter for supervision of other normal pregnancy, second trimester: Secondary | ICD-10-CM

## 2020-06-05 DIAGNOSIS — O09522 Supervision of elderly multigravida, second trimester: Secondary | ICD-10-CM

## 2020-06-05 DIAGNOSIS — Z8249 Family history of ischemic heart disease and other diseases of the circulatory system: Secondary | ICD-10-CM

## 2020-06-05 DIAGNOSIS — O98312 Other infections with a predominantly sexual mode of transmission complicating pregnancy, second trimester: Secondary | ICD-10-CM

## 2020-06-05 NOTE — Progress Notes (Signed)
OBSTETRICS PRENATAL VIRTUAL VISIT ENCOUNTER NOTE  Provider location: [facility] [place]   I connected with Maria [name] on 06/05/20 at  8:30 AM EDT by MyChart Video Encounter at home and verified that I am speaking with the correct person using two identifiers.   I discussed the limitations, risks, security and privacy concerns of performing an evaluation and management service virtually and the availability of in person appointments. I also discussed with the patient that there may be a patient responsible charge related to this service. The patient expressed understanding and agreed to proceed. Subjective:  Maria FRIESZ is a 39 y.o. G2P0010 at [redacted]w[redacted]d being seen today for ongoing prenatal care.  She is currently monitored for the following issues for this low-risk pregnancy and has Endometriosis; Bacterial vaginosis; HPV test positive; Supervision of normal pregnancy; Herpes; AMA (advanced maternal age) primigravida 42+; and Family history of congenital heart defect on their problem list.  Patient reports gas pains at times, feels bloated, using Miralax occasionally for constipation. States she eats fruits and veggies and drinks only water.  Contractions: Not present. Vag. Bleeding: None.  Movement: Present. Denies any leaking of fluid.   The following portions of the patient's history were reviewed and updated as appropriate: allergies, current medications, past family history, past medical history, past social history, past surgical history and problem list.   Objective:   Vitals:   06/05/20 0737  BP: 115/78  Pulse: 72  Weight: 167 lb (75.8 kg)    Fetal Status:     Movement: Present     General:  Alert, oriented and cooperative. Patient is in no acute distress.  Respiratory: Normal respiratory effort, no problems with respiration noted  Mental Status: Normal mood and affect. Normal behavior. Normal judgment and thought content.  Rest of physical exam  deferred due to type of encounter  Imaging: [place] MFM OB FOLLOW UP  Result Date: 05/28/2020 ----------------------------------------------------------------------  OBSTETRICS REPORT                       (Signed Final 05/28/2020 05:18 pm) ---------------------------------------------------------------------- Patient Info  ID #:       [id number]                          D.O.B.:  06/27/1981 (38 yrs)  Name:       Maria [name]                  Visit Date: 05/28/2020 02:30 pm ---------------------------------------------------------------------- Performed By  Attending:        Ma Rings MD         Ref. Address:     [address]                                                             [place], [place]  [address]  Performed By:     Lenise Arena        [facility]  Referred By:      Lawernce Pitts CNM ---------------------------------------------------------------------- Orders  #  Description                           Code        Ordered By  1  [place] MFM OB FOLLOW UP                   51025.85    Lin Landsman ----------------------------------------------------------------------  #  Order #                     Accession #                Episode #  1  [id number]                   [id number]                 [id number] ---------------------------------------------------------------------- Indications  Advanced maternal age primigravida 10+,        O89.512  second trimester  Family history of congenital anomaly (FOB-     Z82.79  Bicuspid aortic valve)  Encounter for other antenatal  screening        Z36.2  follow-up  Low Risk Nips  [redacted] weeks gestation of pregnancy                Z3A.23 ---------------------------------------------------------------------- Fetal Evaluation  Num Of Fetuses:         1  Fetal Heart Rate(bpm):  152  Cardiac Activity:       Observed  Presentation:           Cephalic  Placenta:               Anterior  P. Cord Insertion:  Previously Visualized  Amniotic Fluid  AFI FV:      Within normal limits                              Largest Pocket(cm)                              4.58 ---------------------------------------------------------------------- Biometry  BPD:      57.8  mm     G. Age:  23w 5d         66  %    CI:        74.29   %    70 - 86                                                          FL/HC:      18.2   %    19.2 - 20.8  HC:      212.9  mm     G. Age:  23w 2d         43  %    HC/AC:      1.15        1.05 - 1.21  AC:      184.9  mm     G. Age:  23w 2d         46  %    FL/BPD:     67.0   %    71 - 87  FL:       38.7  mm     G. Age:  22w 3d         17  %    FL/AC:      20.9   %    20 - 24  LV:        4.8  mm  Est. FW:     552  gm      1 lb 3 oz     35  % ---------------------------------------------------------------------- OB History  Gravidity:    2         Term:   0        Prem:   0        SAB:   1  TOP:          0       Ectopic:  0        Living: 0 ---------------------------------------------------------------------- Gestational Age  LMP:           23w 1d        Date:  12/18/19                 EDD:   09/23/20  U/S Today:     23w 1d                                        EDD:   09/23/20  Best:          23w 1d     Det. By:  LMP  (12/18/19)          EDD:   09/23/20 ---------------------------------------------------------------------- Anatomy  Cranium:               Appears normal         Aortic Arch:            Appears normal  Cavum:                 Appears normal         Ductal Arch:            Previously seen  Ventricles:            Appears normal          Diaphragm:              Previously seen  Choroid Plexus:        Previously seen        Stomach:                Appears normal, left                                                                        sided  Cerebellum:            Previously seen        Abdomen:                Appears normal  Posterior Fossa:       Previously seen        Abdominal Wall:         Previously seen  Nuchal Fold:           Previously seen        Cord Vessels:           Previously seen  Face:                  Orbits and profile     Kidneys:                Appear normal                         previously seen  Lips:                  Previously seen        Bladder:                Appears normal  Thoracic:              Appears normal         Spine:                  Appears normal  Heart:                 Previously seen        Upper Extremities:      Previously seen  RVOT:                  Previously seen        Lower Extremities:      Previously seen  LVOT:                  Previously seen  Other:  Fetus appears to  be female. Heels and 5th digit previously visualized.          Nasal bone previously visualized. 3VV and 3VTV previously          visualized. ---------------------------------------------------------------------- Cervix Uterus Adnexa  Cervix  Length:           4.12  cm.  Normal appearance by transabdominal scan. ---------------------------------------------------------------------- Comments  This patient was seen for a follow up growth scan due to  advanced maternal age.  As her husband has a bicuspid  aortic valve, the patient had a normal fetal echocardiogram  performed with pediatric cardiology.  She understands the  difficulty of prenatal diagnosis of a bicuspid aortic valve in the  fetus.  She was informed that the fetal growth and amniotic fluid  level appears appropriate for her gestational age.  Due to advanced maternal age, a follow-up growth scan was  scheduled in the third trimester.  ----------------------------------------------------------------------                   Ma RingsVictor Fang, MD Electronically Signed Final Report   05/28/2020 05:18 pm ----------------------------------------------------------------------   Assessment and Plan:  Pregnancy: G2P0010 at 319w2d 1. Family history of congenital heart defect - normal fetal echo - normal NIPS - normal anatomy US  2. Encounter for supervision of other normal pregnancy in second trimester  3. [redacted] weeks gestation of pregnancy  Preterm labor symptoms and general obstetric precautions including but not limited to vaginal bleeding, contractions, leaking of fluid and fetal movement were reviewed in detail with the patient. I discussed the assessment and treatment plan with the patient. The patient was provided an opportunity to ask questions and all were answered. The patient agreed with the plan and demonstrated an understanding of the instructions. The patient was advised to call back or seek an in-person office evaluation/[facility] for any urgent or concerning symptoms. Please refer to After Visit Summary for other counseling recommendations.   I provided 19 minutes of face-to-face time during this encounter.  Return in about 4 weeks (around 07/03/2020).  Future Appointments  Date Time Provider Department Center  07/23/2020  2:15 PM [facility]  07/23/2020  2:30 PM [facility] US3 [facility]    Donette LarryMelanie Evey Mcmahan, CNM [facility], [facility]

## 2020-07-03 ENCOUNTER — Other Ambulatory Visit: Payer: Self-pay

## 2020-07-03 ENCOUNTER — Ambulatory Visit (INDEPENDENT_AMBULATORY_CARE_PROVIDER_SITE_OTHER): Payer: BC Managed Care – PPO | Admitting: Women's Health

## 2020-07-03 VITALS — BP 116/75 | HR 72 | Wt 173.0 lb

## 2020-07-03 DIAGNOSIS — B009 Herpesviral infection, unspecified: Secondary | ICD-10-CM

## 2020-07-03 DIAGNOSIS — Z3480 Encounter for supervision of other normal pregnancy, unspecified trimester: Secondary | ICD-10-CM | POA: Diagnosis not present

## 2020-07-03 DIAGNOSIS — Z3A28 28 weeks gestation of pregnancy: Secondary | ICD-10-CM

## 2020-07-03 DIAGNOSIS — Z3482 Encounter for supervision of other normal pregnancy, second trimester: Secondary | ICD-10-CM

## 2020-07-03 DIAGNOSIS — Z8279 Family history of other congenital malformations, deformations and chromosomal abnormalities: Secondary | ICD-10-CM

## 2020-07-03 LAB — CBC: MCHC: 33.2 g/dL (ref 32.0–36.0)

## 2020-07-03 NOTE — Patient Instructions (Addendum)
Maternity Assessment Unit (MAU)  The Maternity Assessment Unit (MAU) is located at the [facility]. The address is: [address] [place], [place], [place] [address]. Please see map below for additional directions.    The Maternity Assessment Unit is designed to help you during your pregnancy, and for up to 6 weeks after delivery, with any pregnancy- or postpartum-related emergencies, if you think you are in labor, or if your water has broken. For example, if you experience nausea and vomiting, vaginal bleeding, severe abdominal or pelvic pain, elevated blood pressure or other problems related to your pregnancy or postpartum time, please come to the Maternity Assessment Unit for assistance.       AREA PEDIATRIC/FAMILY PRACTICE PHYSICIANS  [facility] [address] [place], [place] [address] Phone - [phone]   Fax - [place] [address] [place], [place]  [address] Phone - [phone]   Fax - [phone]  [place] [address] [place] [address] [place], [place]  [address] Phone - [phone]   Fax - [phone]  [facility] [address] [place], [place]  [address] Phone - [phone]   Fax - [phone]  [place] [address], [place], [place]  [address] Phone - [phone]   Fax - [place] [address] [place], [place]  [address] Phone - [phone]   Fax - [place] [facility] [place] [address], [place], [place]  [address] Phone - [phone]   Fax - [phone]  [place] [address] [place], [place], [place]  [address] Phone - [phone]   Fax - [place] [address] [place], [place]  [address] Phone - [phone]   Fax -  [phone] [facility] [place]. [address] [place], [place]  [address] Phone - [phone]   Fax - [phone]  [place] [address] [place], [place]  [address] Phone - [phone]   Fax - [phone]  [facility] [place], [place], [place]  [address] Phone - [phone]   Fax - [place] [address], [place], [place]  [address] Phone - [phone]   Fax - [phone]  [facility] [address], [place], [place]  [address] Phone - [place], [place]  [address] Phone - [phone]   Fax - [place] [address], [place], [place]  [address] Phone - [phone]   Fax - [phone]  [place] [address] [place], [place]  [address] Phone - [phone]   Fax - [place]. [place], [place]  [address] Phone - [phone]   Fax - [place], [place], [place]  [address] Phone - [phone]   Fax - [place] [address], [address] [place], [place]  [address] Phone - [phone]   Fax - [phone]  DAVID RUBIN [address] [place], [place]  [address] Phone - [phone]   Fax - [place] [address] [place],   [address] Phone - [phone]  Fax - [phone]  [facility] [address] [place], [place]  [address] Phone - [phone]   Fax - [phone] Gerarda Fraction [phone] [address] [place], [place]  [address] Phone - [phone]   Fax - [phone]  [facility] [address] [place], [place]  [address] Phone - [phone]   Fax - [phone]  [facility] [place] [address] [place], [place]  [address] Phone - [phone]   Fax - [phone]          Contraception Choices - WWW.[email or web address] Contraception, also called birth control, refers to methods or devices that prevent pregnancy. Hormonal methods Contraceptive implant  A contraceptive implant is a thin, plastic tube that contains a hormone. It is inserted into the upper part of the arm. It can remain in place for up to 3 years. Progestin-only injections Progestin-only injections are injections of progestin, a synthetic form of the hormone progesterone. They are given every 3 months by a health care provider. Birth control pills  Birth control pills are pills that contain hormones that prevent pregnancy. They must be taken once a day, preferably at the same time each day. Birth control patch  The birth control patch contains hormones that prevent pregnancy. It is placed on the skin and must be changed once a week for three weeks and removed on the fourth week. A prescription is needed to use this method of contraception. Vaginal ring  A vaginal ring contains hormones that prevent pregnancy. It is placed in the vagina for three weeks and removed on the fourth week. After that, the process is repeated with a new ring. A prescription is needed to use this method of contraception. Emergency contraceptive Emergency contraceptives prevent pregnancy after unprotected sex. They come in pill form and can be taken up to 5 days after sex. They work best the sooner they are taken after having sex. Most emergency contraceptives are available without a prescription. This method should not be used as your only form of birth control. Barrier methods Female condom  A female condom is a thin sheath that is worn over the penis during sex. Condoms keep sperm from going inside a woman's body. They can be used with a spermicide to increase their effectiveness.  They should be disposed after a single use. Female condom  A female condom is a soft, loose-fitting sheath that is put into the vagina before sex. The condom keeps sperm from going inside a woman's body. They should be disposed after a single use. Diaphragm  A diaphragm is a soft, dome-shaped barrier. It is inserted into the vagina before sex, along with a spermicide. The diaphragm blocks sperm from entering the uterus, and the spermicide kills sperm. A diaphragm should be left in the vagina for 6-8 hours after sex and removed within 24 hours. A diaphragm is prescribed and fitted by a health care provider. A diaphragm should be replaced every 1-2 years, after giving birth, after gaining more than 15 lb (6.8 kg), and after pelvic surgery. Cervical cap  A cervical cap is a round, soft latex or plastic cup that fits over the cervix. It is inserted into the vagina before sex, along with spermicide. It blocks sperm from entering the uterus. The cap should be left in place for 6-8 hours after sex and removed within 48 hours. A cervical cap must be prescribed and fitted by a health care provider. It should be replaced every 2  years. Sponge  A sponge is a soft, circular piece of polyurethane foam with spermicide on it. The sponge helps block sperm from entering the uterus, and the spermicide kills sperm. To use it, you make it wet and then insert it into the vagina. It should be inserted before sex, left in for at least 6 hours after sex, and removed and thrown away within 30 hours. Spermicides Spermicides are chemicals that kill or block sperm from entering the cervix and uterus. They can come as a cream, jelly, suppository, foam, or tablet. A spermicide should be inserted into the vagina with an applicator at least 10-15 minutes before sex to allow time for it to work. The process must be repeated every time you have sex. Spermicides do not require a prescription. Intrauterine contraception Intrauterine  device (IUD) An IUD is a T-shaped device that is put in a woman's uterus. There are two types:  Hormone IUD.This type contains progestin, a synthetic form of the hormone progesterone. This type can stay in place for 3-5 years.  Copper IUD.This type is wrapped in copper wire. It can stay in place for 10 years.  Permanent methods of contraception Female tubal ligation In this method, a woman's fallopian tubes are sealed, tied, or blocked during surgery to prevent eggs from traveling to the uterus. Hysteroscopic sterilization In this method, a small, flexible insert is placed into each fallopian tube. The inserts cause scar tissue to form in the fallopian tubes and block them, so sperm cannot reach an egg. The procedure takes about 3 months to be effective. Another form of birth control must be used during those 3 months. Female sterilization This is a procedure to tie off the tubes that carry sperm (vasectomy). After the procedure, the man can still ejaculate fluid (semen). Natural planning methods Natural family planning In this method, a couple does not have sex on days when the woman could become pregnant. Calendar method This means keeping track of the length of each menstrual cycle, identifying the days when pregnancy can happen, and not having sex on those days. Ovulation method In this method, a couple avoids sex during ovulation. Symptothermal method This method involves not having sex during ovulation. The woman typically checks for ovulation by watching changes in her temperature and in the consistency of cervical mucus. Post-ovulation method In this method, a couple waits to have sex until after ovulation. Summary  Contraception, also called birth control, means methods or devices that prevent pregnancy.  Hormonal methods of contraception include implants, injections, pills, patches, vaginal rings, and emergency contraceptives.  Barrier methods of contraception can include female  condoms, female condoms, diaphragms, cervical caps, sponges, and spermicides.  There are two types of IUDs (intrauterine devices). An IUD can be put in a woman's uterus to prevent pregnancy for 3-5 years.  Permanent sterilization can be done through a procedure for males, females, or both.  Natural family planning methods involve not having sex on days when the woman could become pregnant. This information is not intended to replace advice given to you by your health care provider. Make sure you discuss any questions you have with your health care provider. Document Revised: 08/03/2017 Document Reviewed: 09/03/2016 Elsevier Patient Education  2020 Elsevier Inc.        Pregnancy and Influenza  Influenza, also called the flu, is an infection of the lungs and airways (respiratory tract). If you are pregnant, you are more likely to catch the flu. You are also more likely to have  a more serious case of the flu. This is because pregnancy causes changes to your body's disease-fighting system (immune system), heart, and lungs. If you develop a bad case of the flu, especially with a high fever, this can cause problems for you and your developing baby. How do people get the flu? The flu is caused by a type of germ called a virus. It spreads when virus particles get passed from person to person by:  Being near a sick person who is coughing or sneezing.  Touching something that has the virus on it and then touching your mouth, nose, or face. The influenza virus is most common during the fall and winter. How can I protect myself against the flu?  Get a flu shot. The best way to prevent the flu is to get a flu shot before flu season starts. The flu shot is not dangerous for your developing baby. It may even help protect your baby from the flu for up to 6 months after birth.  Wash your hands often with soap and warm water. If soap and water are not available, use hand sanitizer.  Do not come in  close contact with sick people.  Do not share food, drinks, or utensils with other people.  Avoid touching your eyes, nose, and mouth.  Clean frequently used surfaces at home, school, or work.  Practice healthy lifestyle habits, such as: ? Eating a healthy, balanced diet. ? Drinking plenty of fluids. ? Exercising regularly or as told by your health care provider. ? Sleeping 7-9 hours each night. ? Finding ways to manage stress. What should I do if I have flu symptoms?  If you have any symptoms of the flu, even after getting a flu shot, contact your health care provider right away.  To reduce fever, take over-the-counter acetaminophen as told by your health care provider.  If you have the flu, you may get antiviral medicine to keep the flu from becoming severe and to shorten how long it lasts.  Avoid spreading the flu to others: ? Stay home until you are well. ? Cover your nose and mouth when you cough or sneeze. ? Wash your hands often. Follow these instructions at home:  Take over-the-counter and prescription medicines only as told by your health care provider. Do not take any medicine, including cold or flu medicine, unless your health care provider tells you to do so.  If you were prescribed antiviral medicine, take it as told by your health care provider. Do not stop taking the antiviral medicine even if you start to feel better.  Eat a nutrient-rich diet that includes fresh fruits and vegetables, whole grains, lean protein, and low-fat dairy.  Drink enough fluid to keep your urine clear or pale yellow.  Get plenty of rest. Contact a health care provider if:  You have fever or chills.  You have a cough, sore throat, or stuffy nose.  You have worsening or unusual: ? Muscle aches. ? Headache. ? Tiredness. ? Loss of appetite.  You have vomiting or diarrhea. Get help right away if:  You have trouble breathing.  You have chest pain.  You have abdominal  pain.  You begin to have labor pains.  You have a fever that does not go down 24 hours after you take medicine.  You do not feel your baby move.  You have diarrhea or vomiting that will not go away.  You have dizziness or confusion.  Your symptoms do not improve, even with  treatment. Summary  If you are pregnant, you are more likely to catch the flu. You are also more likely to have a more serious case of the flu.  If you have flu-like symptoms, call your health care provider right away. If you develop a bad case of the flu, especially with a high fever, this can be dangerous for your developing baby.  The best way to prevent the flu is to get a flu shot before flu season starts. The flu shot is not dangerous for your developing baby.  If you have the flu and were prescribed antiviral medicine, take it as told by your health care provider. This information is not intended to replace advice given to you by your health care provider. Make sure you discuss any questions you have with your health care provider. Document Revised: 11/23/2018 Document Reviewed: 09/27/2016 Elsevier Patient Education  2020 [employer].       https://www.cdc.gov/vaccines/hcp/vis/vis-statements/tdap.pdf">  Tdap (Tetanus, Diphtheria, Pertussis) Vaccine: What You Need to Know 1. Why get vaccinated? Tdap vaccine can prevent tetanus, diphtheria, and pertussis. Diphtheria and pertussis spread from person to person. Tetanus enters the body through cuts or wounds.  TETANUS (T) causes painful stiffening of the muscles. Tetanus can lead to serious health problems, including being unable to open the mouth, having trouble swallowing and breathing, or death.  DIPHTHERIA (D) can lead to difficulty breathing, heart failure, paralysis, or death.  PERTUSSIS (aP), also known as "whooping cough," can cause uncontrollable, violent coughing which makes it hard to breathe, eat, or drink. Pertussis can be extremely serious  in babies and young children, causing pneumonia, convulsions, brain damage, or death. In teens and adults, it can cause weight loss, loss of bladder control, passing out, and rib fractures from severe coughing. 2. Tdap vaccine Tdap is only for children 7 years and older, adolescents, and adults.  Adolescents should receive a single dose of Tdap, preferably at age 15 or 12 years. Pregnant women should get a dose of Tdap during every pregnancy, to protect the newborn from pertussis. Infants are most at risk for severe, life-threatening complications from pertussis. Adults who have never received Tdap should get a dose of Tdap. Also, adults should receive a booster dose every 10 years, or earlier in the case of a severe and dirty wound or burn. Booster doses can be either Tdap or Td (a different vaccine that protects against tetanus and diphtheria but not pertussis). Tdap may be given at the same time as other vaccines. 3. Talk with your health care provider Tell your vaccine provider if the person getting the vaccine:  Has had an allergic reaction after a previous dose of any vaccine that protects against tetanus, diphtheria, or pertussis, or has any severe, life-threatening allergies.  Has had a coma, decreased level of consciousness, or prolonged seizures within 7 days after a previous dose of any pertussis vaccine (DTP, DTaP, or Tdap).  Has seizures or another nervous system problem.  Has ever had Guillain-Barr Syndrome (also called GBS).  Has had severe pain or swelling after a previous dose of any vaccine that protects against tetanus or diphtheria. In some cases, your health care provider may decide to postpone Tdap vaccination to a future visit.  People with minor illnesses, such as a cold, may be vaccinated. People who are moderately or severely ill should usually wait until they recover before getting Tdap vaccine.  Your health care provider can give you more information. 4. Risks of a  vaccine reaction  Pain, redness, or swelling where the shot was given, mild fever, headache, feeling tired, and nausea, vomiting, diarrhea, or stomachache sometimes happen after Tdap vaccine. People sometimes faint after medical procedures, including vaccination. Tell your provider if you feel dizzy or have vision changes or ringing in the ears.  As with any medicine, there is a very remote chance of a vaccine causing a severe allergic reaction, other serious injury, or death. 5. What if there is a serious problem? An allergic reaction could occur after the vaccinated person leaves the clinic. If you see signs of a severe allergic reaction (hives, swelling of the face and throat, difficulty breathing, a fast heartbeat, dizziness, or weakness), call 9-1-1 and get the person to the nearest hospital. For other signs that concern you, call your health care provider.  Adverse reactions should be reported to the Vaccine Adverse Event Reporting System (VAERS). Your health care provider will usually file this report, or you can do it yourself. Visit the VAERS website at www.vaers.LAgents.no or call 937 390 9196. VAERS is only for reporting reactions, and VAERS staff do not give medical advice. 6. The National Vaccine Injury Compensation Program The [employer] Vaccine Injury Compensation Program (VICP) is a federal program that was created to compensate people who may have been injured by certain vaccines. Visit the VICP website at SpiritualWord.at or call 773 324 2477 to learn about the program and about filing a claim. There is a time limit to file a claim for compensation. 7. How can I learn more?  Ask your health care provider.  Call your local or state health department.  Contact the Centers for Disease Control and Prevention (CDC): ? Call (618) 433-2342 (1-800-CDC-INFO) or ? Visit CDC's website at PicCapture.uy Vaccine Information Statement Tdap (Tetanus, Diphtheria, Pertussis)  Vaccine (11/14/2018) This information is not intended to replace advice given to you by your health care provider. Make sure you discuss any questions you have with your health care provider. Document Revised: 11/23/2018 Document Reviewed: 11/26/2018 Elsevier Patient Education  2020 [employer].        Contraception Choices Contraception, also called birth control, refers to methods or devices that prevent pregnancy. Hormonal methods Contraceptive implant  A contraceptive implant is a thin, plastic tube that contains a hormone. It is inserted into the upper part of the arm. It can remain in place for up to 3 years. Progestin-only injections Progestin-only injections are injections of progestin, a synthetic form of the hormone progesterone. They are given every 3 months by a health care provider. Birth control pills  Birth control pills are pills that contain hormones that prevent pregnancy. They must be taken once a day, preferably at the same time each day. Birth control patch  The birth control patch contains hormones that prevent pregnancy. It is placed on the skin and must be changed once a week for three weeks and removed on the fourth week. A prescription is needed to use this method of contraception. Vaginal ring  A vaginal ring contains hormones that prevent pregnancy. It is placed in the vagina for three weeks and removed on the fourth week. After that, the process is repeated with a new ring. A prescription is needed to use this method of contraception. Emergency contraceptive Emergency contraceptives prevent pregnancy after unprotected sex. They come in pill form and can be taken up to 5 days after sex. They work best the sooner they are taken after having sex. Most emergency contraceptives are available without a prescription. This method should not be used as  your only form of birth control. Barrier methods Female condom  A female condom is a thin sheath that is worn over  the penis during sex. Condoms keep sperm from going inside a woman's body. They can be used with a spermicide to increase their effectiveness. They should be disposed after a single use. Female condom  A female condom is a soft, loose-fitting sheath that is put into the vagina before sex. The condom keeps sperm from going inside a woman's body. They should be disposed after a single use. Diaphragm  A diaphragm is a soft, dome-shaped barrier. It is inserted into the vagina before sex, along with a spermicide. The diaphragm blocks sperm from entering the uterus, and the spermicide kills sperm. A diaphragm should be left in the vagina for 6-8 hours after sex and removed within 24 hours. A diaphragm is prescribed and fitted by a health care provider. A diaphragm should be replaced every 1-2 years, after giving birth, after gaining more than 15 lb (6.8 kg), and after pelvic surgery. Cervical cap  A cervical cap is a round, soft latex or plastic cup that fits over the cervix. It is inserted into the vagina before sex, along with spermicide. It blocks sperm from entering the uterus. The cap should be left in place for 6-8 hours after sex and removed within 48 hours. A cervical cap must be prescribed and fitted by a health care provider. It should be replaced every 2 years. Sponge  A sponge is a soft, circular piece of polyurethane foam with spermicide on it. The sponge helps block sperm from entering the uterus, and the spermicide kills sperm. To use it, you make it wet and then insert it into the vagina. It should be inserted before sex, left in for at least 6 hours after sex, and removed and thrown away within 30 hours. Spermicides Spermicides are chemicals that kill or block sperm from entering the cervix and uterus. They can come as a cream, jelly, suppository, foam, or tablet. A spermicide should be inserted into the vagina with an applicator at least 10-15 minutes before sex to allow time for it to  work. The process must be repeated every time you have sex. Spermicides do not require a prescription. Intrauterine contraception Intrauterine device (IUD) An IUD is a T-shaped device that is put in a woman's uterus. There are two types:  Hormone IUD.This type contains progestin, a synthetic form of the hormone progesterone. This type can stay in place for 3-5 years.  Copper IUD.This type is wrapped in copper wire. It can stay in place for 10 years.  Permanent methods of contraception Female tubal ligation In this method, a woman's fallopian tubes are sealed, tied, or blocked during surgery to prevent eggs from traveling to the uterus. Hysteroscopic sterilization In this method, a small, flexible insert is placed into each fallopian tube. The inserts cause scar tissue to form in the fallopian tubes and block them, so sperm cannot reach an egg. The procedure takes about 3 months to be effective. Another form of birth control must be used during those 3 months. Female sterilization This is a procedure to tie off the tubes that carry sperm (vasectomy). After the procedure, the man can still ejaculate fluid (semen). Natural planning methods Natural family planning In this method, a couple does not have sex on days when the woman could become pregnant. Calendar method This means keeping track of the length of each menstrual cycle, identifying the days when pregnancy can  happen, and not having sex on those days. Ovulation method In this method, a couple avoids sex during ovulation. Symptothermal method This method involves not having sex during ovulation. The woman typically checks for ovulation by watching changes in her temperature and in the consistency of cervical mucus. Post-ovulation method In this method, a couple waits to have sex until after ovulation. Summary  Contraception, also called birth control, means methods or devices that prevent pregnancy.  Hormonal methods of contraception  include implants, injections, pills, patches, vaginal rings, and emergency contraceptives.  Barrier methods of contraception can include female condoms, female condoms, diaphragms, cervical caps, sponges, and spermicides.  There are two types of IUDs (intrauterine devices). An IUD can be put in a woman's uterus to prevent pregnancy for 3-5 years.  Permanent sterilization can be done through a procedure for males, females, or both.  Natural family planning methods involve not having sex on days when the woman could become pregnant. This information is not intended to replace advice given to you by your health care provider. Make sure you discuss any questions you have with your health care provider. Document Revised: 08/03/2017 Document Reviewed: 09/03/2016 Elsevier Patient Education  2020 [employer].

## 2020-07-03 NOTE — Progress Notes (Addendum)
Subjective:  Maria [name] is a 39 y.o. G2P0010 at [redacted]w[redacted]d being seen today for ongoing prenatal care.  She is currently monitored for the following issues for this low-risk pregnancy and has Endometriosis; Bacterial vaginosis; HPV test positive; Supervision of normal pregnancy; Herpes; AMA (advanced maternal age) primigravida 22+; and Family history of congenital heart defect on their problem list.  Patient reports no complaints.  Contractions: Not present. Vag. Bleeding: None.  Movement: Present. Denies leaking of fluid.   The following portions of the patient's history were reviewed and updated as appropriate: allergies, current medications, past family history, past medical history, past social history, past surgical history and problem list. Problem list updated.  Objective:   Vitals:   07/03/20 0817  BP: 116/75  Pulse: 72  Weight: 173 lb (78.5 kg)    Fetal Status: Fetal Heart Rate (bpm): 151 Fundal Height: 27 cm Movement: Present     General:  Alert, oriented and cooperative. Patient is in no acute distress.  Skin: Skin is warm and dry. No rash noted.   Cardiovascular: Normal heart rate noted  Respiratory: Normal respiratory effort, no problems with respiration noted  Abdomen: Soft, gravid, appropriate for gestational age. Pain/Pressure: Absent     Pelvic: Vag. Bleeding: None Vag D/C Character: Thin   Cervical exam deferred        Extremities: Normal range of motion.  Edema: None  Mental Status: Normal mood and affect. Normal behavior. Normal judgment and thought content.   Urinalysis:      Assessment and Plan:  Pregnancy: G2P0010 at [redacted]w[redacted]d  1. [redacted] weeks gestation of pregnancy - 2Hr GTT w/ 1 Hr Carpenter 75 g - HIV antibody (with reflex) - CBC - RPR  2. Encounter for supervision of other normal pregnancy in second trimester - discussed flu/Tdap, pt declines today, will get at next visit - discussed contraception, pt unsure - peds list given - f/u anatomy US scheduled  07/23/2020  3. Herpes - suppression at 35/36 weeks  4. Family history of congenital heart defect - fetal echo 10/11 normal per patient and previous note  Preterm labor symptoms and general obstetric precautions including but not limited to vaginal bleeding, contractions, leaking of fluid and fetal movement were reviewed in detail with the patient. I discussed the assessment and treatment plan with the patient. The patient was provided an opportunity to ask questions and all were answered. The patient agreed with the plan and demonstrated an understanding of the instructions. The patient was advised to call back or seek an in-person office evaluation/go to [facility] at [facility] for any urgent or concerning symptoms. Please refer to After Visit Summary for other counseling recommendations.  Return in about 2 weeks (around 07/17/2020) for in-person LOB/APP OK/Tdap/Flu.   Caci Orren, Odie Sera, NP

## 2020-07-06 LAB — HIV ANTIBODY (ROUTINE TESTING W REFLEX): HIV 1&2 Ab, 4th Generation: NONREACTIVE

## 2020-07-06 LAB — 2HR GTT W 1 HR, CARPENTER, 75 G
Glucose, 1 Hr, Gest: 152 mg/dL (ref 65–179)
Glucose, 2 Hr, Gest: 132 mg/dL (ref 65–152)
Glucose, Fasting, Gest: 71 mg/dL (ref 65–91)

## 2020-07-06 LAB — CBC
HCT: 38.5 % (ref 35.0–45.0)
Hemoglobin: 12.8 g/dL (ref 11.7–15.5)
MCH: 31.3 pg (ref 27.0–33.0)
MCV: 94.1 fL (ref 80.0–100.0)
MPV: 10.6 fL (ref 7.5–12.5)
Platelets: 265 10*3/uL (ref 140–400)
RBC: 4.09 10*6/uL (ref 3.80–5.10)
RDW: 12 % (ref 11.0–15.0)
WBC: 8.4 10*3/uL (ref 3.8–10.8)

## 2020-07-06 LAB — RPR: RPR Ser Ql: NONREACTIVE

## 2020-07-17 ENCOUNTER — Other Ambulatory Visit: Payer: Self-pay

## 2020-07-17 ENCOUNTER — Ambulatory Visit (INDEPENDENT_AMBULATORY_CARE_PROVIDER_SITE_OTHER): Payer: BC Managed Care – PPO | Admitting: Certified Nurse Midwife

## 2020-07-17 VITALS — BP 115/74 | HR 90 | Wt 177.0 lb

## 2020-07-17 DIAGNOSIS — Z23 Encounter for immunization: Secondary | ICD-10-CM

## 2020-07-17 DIAGNOSIS — Z3A30 30 weeks gestation of pregnancy: Secondary | ICD-10-CM

## 2020-07-17 DIAGNOSIS — O09513 Supervision of elderly primigravida, third trimester: Secondary | ICD-10-CM

## 2020-07-17 DIAGNOSIS — Z8279 Family history of other congenital malformations, deformations and chromosomal abnormalities: Secondary | ICD-10-CM

## 2020-07-17 DIAGNOSIS — Z3482 Encounter for supervision of other normal pregnancy, second trimester: Secondary | ICD-10-CM

## 2020-07-17 DIAGNOSIS — R4586 Emotional lability: Secondary | ICD-10-CM

## 2020-07-17 NOTE — Progress Notes (Signed)
Subjective:  Maria [name] is a 39 y.o. G2P0010 at [redacted]w[redacted]d being seen today for ongoing prenatal care.  She is currently monitored for the following issues for this low-risk pregnancy and has Endometriosis; Bacterial vaginosis; HPV test positive; Supervision of normal pregnancy; Herpes; AMA (advanced maternal age) primigravida 26+; and Family history of congenital heart defect on their problem list.  Patient reports had a few episodes of feeling sad over the last few weeks. No SI/HI. Reports thinking about her mom who has passed away and wont be here when the baby comes. Had depression when her mom died 2 yrs ago, was taking Prozac, felt it was situational, no longer needed.  Contractions: Not present. Vag. Bleeding: None.  Movement: Present. Denies leaking of fluid.   The following portions of the patient's history were reviewed and updated as appropriate: allergies, current medications, past family history, past medical history, past social history, past surgical history and problem list. Problem list updated.  Objective:   Vitals:   07/17/20 0808  BP: 115/74  Pulse: 90  Weight: 177 lb (80.3 kg)    Fetal Status: Fetal Heart Rate (bpm): 156   Movement: Present     General:  Alert, oriented and cooperative. Patient is in no acute distress.  Skin: Skin is warm and dry. No rash noted.   Cardiovascular: Normal heart rate noted  Respiratory: Normal respiratory effort, no problems with respiration noted  Abdomen: Soft, gravid, appropriate for gestational age. Pain/Pressure: Absent     Pelvic: Vag. Bleeding: None Vag D/C Character: Thin   Cervical exam deferred        Extremities: Normal range of motion.  Edema: Trace  Mental Status: Normal mood and affect. Normal behavior. Normal judgment and thought content.   Urinalysis:      Assessment and Plan:  Pregnancy: G2P0010 at [redacted]w[redacted]d  1. [redacted] weeks gestation of pregnancy  2. Family history of congenital heart defect - normal fetal echo  3.  Primigravida of advanced maternal age in third trimester - growth [place] scheduled next week  4. Mood changes - likely normal given holiday season, loss of parent, and now third trimester - not interested in treatment at this time, just wanted to make [place] aware - reviewed warning signs, when to call   Preterm labor symptoms and general obstetric precautions including but not limited to vaginal bleeding, contractions, leaking of fluid and fetal movement were reviewed in detail with the patient. Please refer to After Visit Summary for other counseling recommendations.  Return in about 2 weeks (around 07/31/2020).- virtual   Donette Larry, CNM

## 2020-07-17 NOTE — Patient Instructions (Signed)
Third Trimester of Pregnancy The third trimester is from week 28 through week 40 (months 7 through 9). The third trimester is a time when the unborn baby (fetus) is growing rapidly. At the end of the ninth month, the fetus is about 20 inches in length and weighs 6-10 pounds. Body changes during your third trimester Your body will continue to go through many changes during pregnancy. The changes vary from woman to woman. During the third trimester:  Your weight will continue to increase. You can expect to gain 25-35 pounds (11-16 kg) by the end of the pregnancy.  You may begin to get stretch marks on your hips, abdomen, and breasts.  You may urinate more often because the fetus is moving lower into your pelvis and pressing on your bladder.  You may develop or continue to have heartburn. This is caused by increased hormones that slow down muscles in the digestive tract.  You may develop or continue to have constipation because increased hormones slow digestion and cause the muscles that push waste through your intestines to relax.  You may develop hemorrhoids. These are swollen veins (varicose veins) in the rectum that can itch or be painful.  You may develop swollen, bulging veins (varicose veins) in your legs.  You may have increased body aches in the pelvis, back, or thighs. This is due to weight gain and increased hormones that are relaxing your joints.  You may have changes in your hair. These can include thickening of your hair, rapid growth, and changes in texture. Some women also have hair loss during or after pregnancy, or hair that feels dry or thin. Your hair will most likely return to normal after your baby is born.  Your breasts will continue to grow and they will continue to become tender. A yellow fluid (colostrum) may leak from your breasts. This is the first milk you are producing for your baby.  Your belly button may stick out.  You may notice more swelling in your hands,  face, or ankles.  You may have increased tingling or numbness in your hands, arms, and legs. The skin on your belly may also feel numb.  You may feel short of breath because of your expanding uterus.  You may have more problems sleeping. This can be caused by the size of your belly, increased need to urinate, and an increase in your body's metabolism.  You may notice the fetus "dropping," or moving lower in your abdomen (lightening).  You may have increased vaginal discharge.  You may notice your joints feel loose and you may have pain around your pelvic bone. What to expect at prenatal visits You will have prenatal exams every 2 weeks until week 36. Then you will have weekly prenatal exams. During a routine prenatal visit:  You will be weighed to make sure you and the baby are growing normally.  Your blood pressure will be taken.  Your abdomen will be measured to track your baby's growth.  The fetal heartbeat will be listened to.  Any test results from the previous visit will be discussed.  You may have a cervical check near your due date to see if your cervix has softened or thinned (effaced).  You will be tested for Group B streptococcus. This happens between 35 and 37 weeks. Your health care provider may ask you:  What your birth plan is.  How you are feeling.  If you are feeling the baby move.  If you have had any abnormal   symptoms, such as leaking fluid, bleeding, severe headaches, or abdominal cramping.  If you are using any tobacco products, including cigarettes, chewing tobacco, and electronic cigarettes.  If you have any questions. Other tests or screenings that may be performed during your third trimester include:  Blood tests that check for low iron levels (anemia).  Fetal testing to check the health, activity level, and growth of the fetus. Testing is done if you have certain medical conditions or if there are problems during the pregnancy.  Nonstress test  (NST). This test checks the health of your baby to make sure there are no signs of problems, such as the baby not getting enough oxygen. During this test, a belt is placed around your belly. The baby is made to move, and its heart rate is monitored during movement. What is false labor? False labor is a condition in which you feel small, irregular tightenings of the muscles in the womb (contractions) that usually go away with rest, changing position, or drinking water. These are called Braxton Hicks contractions. Contractions may last for hours, days, or even weeks before true labor sets in. If contractions come at regular intervals, become more frequent, increase in intensity, or become painful, you should see your health care provider. What are the signs of labor?  Abdominal cramps.  Regular contractions that start at 10 minutes apart and become stronger and more frequent with time.  Contractions that start on the top of the uterus and spread down to the lower abdomen and back.  Increased pelvic pressure and dull back pain.  A watery or bloody mucus discharge that comes from the vagina.  Leaking of amniotic fluid. This is also known as your "water breaking." It could be a slow trickle or a gush. Let your health care provider know if it has a color or strange odor. If you have any of these signs, call your health care provider right away, even if it is before your due date. Follow these instructions at home: Medicines  Follow your health care provider's instructions regarding medicine use. Specific medicines may be either safe or unsafe to take during pregnancy.  Take a prenatal vitamin that contains at least 600 micrograms (mcg) of folic acid.  If you develop constipation, try taking a stool softener if your health care provider approves. Eating and drinking   Eat a balanced diet that includes fresh fruits and vegetables, whole grains, good sources of protein such as meat, eggs, or tofu,  and low-fat dairy. Your health care provider will help you determine the amount of weight gain that is right for you.  Avoid raw meat and uncooked cheese. These carry germs that can cause birth defects in the baby.  If you have low calcium intake from food, talk to your health care provider about whether you should take a daily calcium supplement.  Eat four or five small meals rather than three large meals a day.  Limit foods that are high in fat and processed sugars, such as fried and sweet foods.  To prevent constipation: ? Drink enough fluid to keep your urine clear or pale yellow. ? Eat foods that are high in fiber, such as fresh fruits and vegetables, whole grains, and beans. Activity  Exercise only as directed by your health care provider. Most women can continue their usual exercise routine during pregnancy. Try to exercise for 30 minutes at least 5 days a week. Stop exercising if you experience uterine contractions.  Avoid heavy lifting.  Do   not exercise in extreme heat or humidity, or at high altitudes.  Wear low-heel, comfortable shoes.  Practice good posture.  You may continue to have sex unless your health care provider tells you otherwise. Relieving pain and discomfort  Take frequent breaks and rest with your legs elevated if you have leg cramps or low back pain.  Take warm sitz baths to soothe any pain or discomfort caused by hemorrhoids. Use hemorrhoid cream if your health care provider approves.  Wear a good support bra to prevent discomfort from breast tenderness.  If you develop varicose veins: ? Wear support pantyhose or compression stockings as told by your healthcare provider. ? Elevate your feet for 15 minutes, 3-4 times a day. Prenatal care  Write down your questions. Take them to your prenatal visits.  Keep all your prenatal visits as told by your health care provider. This is important. Safety  Wear your seat belt at all times when driving.  Make  a list of emergency phone numbers, including numbers for family, friends, the hospital, and police and fire departments. General instructions  Avoid cat litter boxes and soil used by cats. These carry germs that can cause birth defects in the baby. If you have a cat, ask someone to clean the litter box for you.  Do not travel far distances unless it is absolutely necessary and only with the approval of your health care provider.  Do not use hot tubs, steam rooms, or saunas.  Do not drink alcohol.  Do not use any products that contain nicotine or tobacco, such as cigarettes and e-cigarettes. If you need help quitting, ask your health care provider.  Do not use any medicinal herbs or unprescribed drugs. These chemicals affect the formation and growth of the baby.  Do not douche or use tampons or scented sanitary pads.  Do not cross your legs for long periods of time.  To prepare for the arrival of your baby: ? Take prenatal classes to understand, practice, and ask questions about labor and delivery. ? Make a trial run to the hospital. ? Visit the hospital and tour the maternity area. ? Arrange for maternity or paternity leave through employers. ? Arrange for family and friends to take care of pets while you are in the hospital. ? Purchase a rear-facing car seat and make sure you know how to install it in your car. ? Pack your hospital bag. ? Prepare the baby's nursery. Make sure to remove all pillows and stuffed animals from the baby's crib to prevent suffocation.  Visit your dentist if you have not gone during your pregnancy. Use a soft toothbrush to brush your teeth and be gentle when you floss. Contact a health care provider if:  You are unsure if you are in labor or if your water has broken.  You become dizzy.  You have mild pelvic cramps, pelvic pressure, or nagging pain in your abdominal area.  You have lower back pain.  You have persistent nausea, vomiting, or  diarrhea.  You have an unusual or bad smelling vaginal discharge.  You have pain when you urinate. Get help right away if:  Your water breaks before 37 weeks.  You have regular contractions less than 5 minutes apart before 37 weeks.  You have a fever.  You are leaking fluid from your vagina.  You have spotting or bleeding from your vagina.  You have severe abdominal pain or cramping.  You have rapid weight loss or weight gain.  You have   shortness of breath with chest pain.  You notice sudden or extreme swelling of your face, hands, ankles, feet, or legs.  Your baby makes fewer than 10 movements in 2 hours.  You have severe headaches that do not go away when you take medicine.  You have vision changes. Summary  The third trimester is from week 28 through week 40, months 7 through 9. The third trimester is a time when the unborn baby (fetus) is growing rapidly.  During the third trimester, your discomfort may increase as you and your baby continue to gain weight. You may have abdominal, leg, and back pain, sleeping problems, and an increased need to urinate.  During the third trimester your breasts will keep growing and they will continue to become tender. A yellow fluid (colostrum) may leak from your breasts. This is the first milk you are producing for your baby.  False labor is a condition in which you feel small, irregular tightenings of the muscles in the womb (contractions) that eventually go away. These are called Braxton Hicks contractions. Contractions may last for hours, days, or even weeks before true labor sets in.  Signs of labor can include: abdominal cramps; regular contractions that start at 10 minutes apart and become stronger and more frequent with time; watery or bloody mucus discharge that comes from the vagina; increased pelvic pressure and dull back pain; and leaking of amniotic fluid. This information is not intended to replace advice given to you by your  health care provider. Make sure you discuss any questions you have with your health care provider. Document Revised: 11/22/2018 Document Reviewed: 09/06/2016 Elsevier Patient Education  2020 [employer]  

## 2020-07-23 ENCOUNTER — Ambulatory Visit: Payer: BC Managed Care – PPO

## 2020-07-24 ENCOUNTER — Other Ambulatory Visit: Payer: Self-pay

## 2020-07-24 ENCOUNTER — Ambulatory Visit: Payer: BC Managed Care – PPO | Attending: Obstetrics and Gynecology | Admitting: *Deleted

## 2020-07-24 ENCOUNTER — Ambulatory Visit (HOSPITAL_BASED_OUTPATIENT_CLINIC_OR_DEPARTMENT_OTHER): Payer: BC Managed Care – PPO

## 2020-07-24 ENCOUNTER — Encounter: Payer: Self-pay | Admitting: *Deleted

## 2020-07-24 DIAGNOSIS — O09523 Supervision of elderly multigravida, third trimester: Secondary | ICD-10-CM

## 2020-07-24 DIAGNOSIS — Z8279 Family history of other congenital malformations, deformations and chromosomal abnormalities: Secondary | ICD-10-CM

## 2020-07-24 DIAGNOSIS — Z3A31 31 weeks gestation of pregnancy: Secondary | ICD-10-CM | POA: Insufficient documentation

## 2020-07-24 DIAGNOSIS — Z362 Encounter for other antenatal screening follow-up: Secondary | ICD-10-CM | POA: Diagnosis not present

## 2020-07-24 NOTE — Progress Notes (Signed)
C/o" lower backache on right side x 1 week."

## 2020-07-27 ENCOUNTER — Other Ambulatory Visit: Payer: Self-pay | Admitting: *Deleted

## 2020-07-31 ENCOUNTER — Telehealth (INDEPENDENT_AMBULATORY_CARE_PROVIDER_SITE_OTHER): Payer: BC Managed Care – PPO | Admitting: Certified Nurse Midwife

## 2020-07-31 VITALS — BP 126/84 | Wt 178.0 lb

## 2020-07-31 DIAGNOSIS — Z3483 Encounter for supervision of other normal pregnancy, third trimester: Secondary | ICD-10-CM

## 2020-07-31 DIAGNOSIS — Z8279 Family history of other congenital malformations, deformations and chromosomal abnormalities: Secondary | ICD-10-CM

## 2020-07-31 DIAGNOSIS — Z3A32 32 weeks gestation of pregnancy: Secondary | ICD-10-CM

## 2020-07-31 DIAGNOSIS — M549 Dorsalgia, unspecified: Secondary | ICD-10-CM

## 2020-07-31 DIAGNOSIS — O99891 Other specified diseases and conditions complicating pregnancy: Secondary | ICD-10-CM

## 2020-07-31 NOTE — Progress Notes (Signed)
OBSTETRICS PRENATAL VIRTUAL VISIT ENCOUNTER NOTE  Provider location: [facility] [place]   I connected with Maria [name] on 07/31/20 at  9:50 AM EST by MyChart Video Encounter at home and verified that I am speaking with the correct person using two identifiers.   I discussed the limitations, risks, security and privacy concerns of performing an evaluation and management service virtually and the availability of in person appointments. I also discussed with the patient that there may be a patient responsible charge related to this service. The patient expressed understanding and agreed to proceed. Subjective:  Maria [name] is a 39 y.o. G2P0010 at [redacted]w[redacted]d being seen today for ongoing prenatal care.  She is currently monitored for the following issues for this low-risk pregnancy and has Endometriosis; Bacterial vaginosis; HPV test positive; Supervision of normal pregnancy; Herpes; AMA (advanced maternal age) primigravida 87+; and Family history of congenital heart defect on their problem list.  Patient reports back and leg pain.  Contractions: Not present. Vag. Bleeding: None.  Movement: Present. Denies any leaking of fluid.   The following portions of the patient's history were reviewed and updated as appropriate: allergies, current medications, past family history, past medical history, past social history, past surgical history and problem list.   Objective:   Vitals:   07/31/20 0939  BP: 126/84  Weight: 178 lb (80.7 kg)    Fetal Status:     Movement: Present     General:  Alert, oriented and cooperative. Patient is in no acute distress.  Respiratory: Normal respiratory effort, no problems with respiration noted  Mental Status: Normal mood and affect. Normal behavior. Normal judgment and thought content.  Rest of physical exam deferred due to type of encounter  Imaging: [place] MFM OB FOLLOW UP  Result Date:  07/24/2020 ----------------------------------------------------------------------  OBSTETRICS REPORT                       (Signed Final 07/24/2020 06:01 pm) ---------------------------------------------------------------------- Patient Info  ID #:       [id number]                          D.O.B.:  04-Mar-1981 (39 yrs)  Name:       Maria [name]                  Visit Date: 07/24/2020 02:45 pm ---------------------------------------------------------------------- Performed By  Attending:        Ma Rings MD         Ref. Address:     [address]                                                             [place], [place]  [address]  Performed By:     Eden Lathe BS      [facility]  Referred By:      Lawernce Pitts CNM ---------------------------------------------------------------------- Orders  #  Description                           Code        Ordered By  1  [place] MFM OB FOLLOW UP                   [phone]    YU FANG ----------------------------------------------------------------------  #  Order #                     Accession #                Episode #  1  [id number]                   [id number]                 [id number] ---------------------------------------------------------------------- Indications  Advanced maternal age multigravida 24+,        O59.523  third trimester (39)  Encounter for other antenatal screening        Z36.2  follow-up  Family history of congenital anomaly (FOB-     Z82.79  Bicuspid aortic valve)  Low Risk Nips  [redacted] weeks gestation of pregnancy                Z3A.31  ---------------------------------------------------------------------- Fetal Evaluation  Num Of Fetuses:         1  Cardiac Activity:       Observed  Presentation:           Cephalic  Placenta:               Anterior  P. Cord Insertion:      Visualized  Amniotic Fluid  AFI FV:      Within normal limits  AFI Sum(cm)     %Tile       Largest Pocket(cm)  12.4            34          4.2  RUQ(cm)  RLQ(cm)       LUQ(cm)        LLQ(cm)  4.2           2.6           2.4            3.2 ---------------------------------------------------------------------- Biometry  BPD:      81.5  mm     G. Age:  32w 5d         82  %    CI:        76.92   %    70 - 86                                                          FL/HC:      18.5   %    19.3 - 21.3  HC:      294.3  mm     G. Age:  32w 3d         46  %    HC/AC:      1.13        0.96 - 1.17  AC:      261.1  mm     G. Age:  30w 2d         19  %    FL/BPD:     66.6   %    71 - 87  FL:       54.3  mm     G. Age:  28w 5d        1.1  %    FL/AC:      20.8   %    20 - 24  HUM:      49.2  mm     G. Age:  29w 0d        < 5  %  LV:        4.2  mm  Est. FW:    1522  gm      3 lb 6 oz     11  % ---------------------------------------------------------------------- OB History  Gravidity:    2         Term:   0        Prem:   0        SAB:   1  TOP:          0       Ectopic:  0        Living: 0 ---------------------------------------------------------------------- Gestational Age  LMP:           31w 2d        Date:  12/18/19                 EDD:   09/23/20  U/S Today:     31w 0d                                        EDD:   09/25/20  Best:          31w 2d     Det. By:  LMP  (12/18/19)          EDD:   09/23/20 ----------------------------------------------------------------------  Anatomy  Cranium:               Appears normal         Aortic Arch:            Previously seen  Cavum:                 Appears normal         Ductal Arch:            Previously seen  Ventricles:            Appears  normal         Diaphragm:              Appears normal  Choroid Plexus:        Appears normal         Stomach:                Appears normal, left                                                                        sided  Cerebellum:            Appears normal         Abdomen:                Appears normal  Posterior Fossa:       Previously seen        Abdominal Wall:         Previously seen  Nuchal Fold:           Previously seen        Cord Vessels:           Previously seen  Face:                  Appears normal         Kidneys:                Appear normal                         (orbits and profile)  Lips:                  Appears normal         Bladder:                Appears normal  Thoracic:              Appears normal         Spine:                  Previously seen  Heart:                 Appears normal         Upper Extremities:      Previously seen                         (4CH, axis, and                         situs)  RVOT:  Appears normal         Lower Extremities:      Previously seen  LVOT:                  Appears normal  Other:  Female gender previously seen. Heels and 5th digit previously          visualized. Nasal bone previously visualized. 3VV and 3VTV          previously visualized. ---------------------------------------------------------------------- Doppler - Fetal Vessels  Umbilical Artery   S/D     %tile      RI    %tile                             ADFV    RDFV   3.06       69    0.67       72                                No      No ---------------------------------------------------------------------- Cervix Uterus Adnexa  Cervix  Not visualized (advanced GA >24wks)  Uterus  No abnormality visualized.  Right Ovary  Within normal limits.  Left Ovary  Within normal limits.  Cul De Sac  No free fluid seen.  Adnexa  No abnormality visualized. ---------------------------------------------------------------------- Comments  This patient was seen for a follow up growth scan  due to  advanced maternal age.  She denies any problems since her  last exam.  She was informed that the fetal growth and amniotic fluid  level appears appropriate for her gestational age.  The overall  EFW obtained today measured at the 11th percentile for her  gestational age.  As the overall EFW measures in the lower normal range, a  follow-up growth scan was scheduled in 3 weeks. ----------------------------------------------------------------------                   Ma Rings, MD Electronically Signed Final Report   07/24/2020 06:01 pm ----------------------------------------------------------------------   Assessment and Plan:  Pregnancy: G2P0010 at [redacted]w[redacted]d 1. [redacted] weeks gestation of pregnancy  2. Family history of congenital heart defect - husband has artificial valve and now with aneurysm; will need replacement surgery soon - had normal fetal echo  3. Encounter for supervision of other normal pregnancy in third trimester  4. Back pain affecting pregnancy in third trimester - saw [facility] chiro twice this week and getting relief - planning weekly treatment  Preterm labor symptoms and general obstetric precautions including but not limited to vaginal bleeding, contractions, leaking of fluid and fetal movement were reviewed in detail with the patient. I discussed the assessment and treatment plan with the patient. The patient was provided an opportunity to ask questions and all were answered. The patient agreed with the plan and demonstrated an understanding of the instructions. The patient was advised to call back or seek an in-person office evaluation/go to [facility]U at [facility] for any urgent or concerning symptoms. Please refer to After Visit Summary for other counseling recommendations.   I provided 16 minutes of face-to-face time during this encounter.  Return in about 2 weeks (around 08/14/2020).  Future Appointments  Date Time Provider Department Center   08/13/2020 11:00 AM Conan Bowens, MD [facility]  08/17/2020  3:45 PM [facility] NURSE [facility]  08/17/2020  4:00 PM [facility] US1  [facility]    Donette LarryMelanie Xavious Sharrar, CNM [facility]

## 2020-08-11 ENCOUNTER — Inpatient Hospital Stay (EMERGENCY_DEPARTMENT_HOSPITAL)
Admission: EM | Admit: 2020-08-11 | Discharge: 2020-08-11 | Disposition: A | Discharge status: Home / Self Care | Payer: BC Managed Care – PPO | Source: Home / Self Care | Attending: Emergency Medicine | Admitting: Emergency Medicine

## 2020-08-11 ENCOUNTER — Encounter (HOSPITAL_COMMUNITY): Payer: Self-pay

## 2020-08-11 ENCOUNTER — Other Ambulatory Visit: Payer: Self-pay

## 2020-08-11 DIAGNOSIS — Z3A33 33 weeks gestation of pregnancy: Secondary | ICD-10-CM | POA: Insufficient documentation

## 2020-08-11 DIAGNOSIS — O365930 Maternal care for other known or suspected poor fetal growth, third trimester, not applicable or unspecified: Secondary | ICD-10-CM | POA: Diagnosis not present

## 2020-08-11 DIAGNOSIS — Z3A34 34 weeks gestation of pregnancy: Secondary | ICD-10-CM | POA: Diagnosis not present

## 2020-08-11 DIAGNOSIS — Z8279 Family history of other congenital malformations, deformations and chromosomal abnormalities: Secondary | ICD-10-CM

## 2020-08-11 DIAGNOSIS — O4703 False labor before 37 completed weeks of gestation, third trimester: Secondary | ICD-10-CM | POA: Diagnosis not present

## 2020-08-11 DIAGNOSIS — Z87891 Personal history of nicotine dependence: Secondary | ICD-10-CM | POA: Insufficient documentation

## 2020-08-11 DIAGNOSIS — O4700 False labor before 37 completed weeks of gestation, unspecified trimester: Secondary | ICD-10-CM

## 2020-08-11 LAB — COMPREHENSIVE METABOLIC PANEL
ALT: 12 U/L (ref 0–44)
AST: 18 U/L (ref 15–41)
Albumin: 2.8 g/dL — ABNORMAL LOW (ref 3.5–5.0)
Alkaline Phosphatase: 79 U/L (ref 38–126)
Anion gap: 10 (ref 5–15)
BUN: 9 mg/dL (ref 6–20)
CO2: 21 mmol/L — ABNORMAL LOW (ref 22–32)
Calcium: 9.1 mg/dL (ref 8.9–10.3)
Chloride: 106 mmol/L (ref 98–111)
Creatinine, Ser: 0.69 mg/dL (ref 0.44–1.00)
GFR, Estimated: >60 mL/min (ref >60)
Glucose, Bld: 86 mg/dL (ref 70–99)
Potassium: 4.2 mmol/L (ref 3.5–5.1)
Sodium: 137 mmol/L (ref 135–145)
Total Bilirubin: 0.3 mg/dL (ref 0.3–1.2)
Total Protein: 6.2 g/dL — ABNORMAL LOW (ref 6.5–8.1)

## 2020-08-11 LAB — CBC
HCT: 36.8 % (ref 36.0–46.0)
Hemoglobin: 13.1 g/dL (ref 12.0–15.0)
MCH: 32.9 pg (ref 26.0–34.0)
MCHC: 35.6 g/dL (ref 30.0–36.0)
MCV: 92.5 fL (ref 80.0–100.0)
Platelets: 238 10*3/uL (ref 150–400)
RBC: 3.98 MIL/uL (ref 3.87–5.11)
RDW: 13.1 % (ref 11.5–15.5)
WBC: 10.7 10*3/uL — ABNORMAL HIGH (ref 4.0–10.5)
nRBC: 0 % (ref 0.0–0.2)

## 2020-08-11 LAB — WET PREP, GENITAL
Clue Cells Wet Prep HPF POC: NONE SEEN
Sperm: NONE SEEN
Trich, Wet Prep: NONE SEEN
Yeast Wet Prep HPF POC: NONE SEEN

## 2020-08-11 LAB — URINALYSIS, ROUTINE W REFLEX MICROSCOPIC
Bilirubin Urine: NEGATIVE
Glucose, UA: NEGATIVE mg/dL
Hgb urine dipstick: NEGATIVE
Ketones, ur: 5 mg/dL — AB
Leukocytes,Ua: NEGATIVE
Nitrite: NEGATIVE
Protein, ur: NEGATIVE mg/dL
Specific Gravity, Urine: 1.02 (ref 1.005–1.030)
pH: 5 (ref 5.0–8.0)

## 2020-08-11 LAB — PROTEIN / CREATININE RATIO, URINE
Creatinine, Urine: 165.94 mg/dL
Protein Creatinine Ratio: 0.07 mg/mg{Cre} (ref 0.00–0.15)
Total Protein, Urine: 12 mg/dL

## 2020-08-11 MED ORDER — ACETAMINOPHEN 325 MG PO TABS
650.0000 mg | ORAL_TABLET | Freq: Once | ORAL | Status: AC
  Administered 2020-08-11: 650 mg via ORAL
  Filled 2020-08-11: qty 2

## 2020-08-11 MED ORDER — NIFEDIPINE 10 MG PO CAPS
10.0000 mg | ORAL_CAPSULE | ORAL | Status: AC | PRN
  Administered 2020-08-11 (×3): 10 mg via ORAL
  Filled 2020-08-11 (×3): qty 1

## 2020-08-11 MED ORDER — CYCLOBENZAPRINE HCL 5 MG PO TABS
10.0000 mg | ORAL_TABLET | Freq: Once | ORAL | Status: AC
  Administered 2020-08-11: 10 mg via ORAL
  Filled 2020-08-11: qty 2

## 2020-08-11 MED ORDER — NIFEDIPINE ER OSMOTIC RELEASE 30 MG PO TB24: 30.0000 mg | ORAL_TABLET | Freq: Two times a day (BID) | ORAL | 0 refills | Status: DC

## 2020-08-11 MED ORDER — BETAMETHASONE SOD PHOS & ACET 6 (3-3) MG/ML IJ SUSP
12.0000 mg | INTRAMUSCULAR | Status: DC
Start: 2020-08-11 — End: 2020-08-11
  Administered 2020-08-11: 12 mg via INTRAMUSCULAR
  Filled 2020-08-11: qty 5

## 2020-08-11 NOTE — Discharge Instructions (Signed)
Braxton Hicks Contractions °Contractions of the uterus can occur throughout pregnancy, but they are not always a sign that you are in labor. You may have practice contractions called Braxton Hicks contractions. These false labor contractions are sometimes confused with true labor. °What are Braxton Hicks contractions? °Braxton Hicks contractions are tightening movements that occur in the muscles of the uterus before labor. Unlike true labor contractions, these contractions do not result in opening (dilation) and thinning of the cervix. Toward the end of pregnancy (32-34 weeks), Braxton Hicks contractions can happen more often and may become stronger. These contractions are sometimes difficult to tell apart from true labor because they can be very uncomfortable. You should not feel embarrassed if you go to the hospital with false labor. °Sometimes, the only way to tell if you are in true labor is for your health care provider to look for changes in the cervix. The health care provider will do a physical exam and may monitor your contractions. If you are not in true labor, the exam should show that your cervix is not dilating and your water has not broken. °If there are no other health problems associated with your pregnancy, it is completely safe for you to be sent home with false labor. You may continue to have Braxton Hicks contractions until you go into true labor. °How to tell the difference between true labor and false labor °True labor °· Contractions last 30-70 seconds. °· Contractions become very regular. °· Discomfort is usually felt in the top of the uterus, and it spreads to the lower abdomen and low back. °· Contractions do not go away with walking. °· Contractions usually become more intense and increase in frequency. °· The cervix dilates and gets thinner. °False labor °· Contractions are usually shorter and not as strong as true labor contractions. °· Contractions are usually irregular. °· Contractions  are often felt in the front of the lower abdomen and in the groin. °· Contractions may go away when you walk around or change positions while lying down. °· Contractions get weaker and are shorter-lasting as time goes on. °· The cervix usually does not dilate or become thin. °Follow these instructions at home: ° °· Take over-the-counter and prescription medicines only as told by your health care provider. °· Keep up with your usual exercises and follow other instructions from your health care provider. °· Eat and drink lightly if you think you are going into labor. °· If Braxton Hicks contractions are making you uncomfortable: °? Change your position from lying down or resting to walking, or change from walking to resting. °? Sit and rest in a tub of warm water. °? Drink enough fluid to keep your urine pale yellow. Dehydration may cause these contractions. °? Do slow and deep breathing several times an hour. °· Keep all follow-up prenatal visits as told by your health care provider. This is important. °Contact a health care provider if: °· You have a fever. °· You have continuous pain in your abdomen. °Get help right away if: °· Your contractions become stronger, more regular, and closer together. °· You have fluid leaking or gushing from your vagina. °· You pass blood-tinged mucus (bloody show). °· You have bleeding from your vagina. °· You have low back pain that you never had before. °· You feel your baby’s head pushing down and causing pelvic pressure. °· Your baby is not moving inside you as much as it used to. °Summary °· Contractions that occur before labor are   called Braxton Hicks contractions, false labor, or practice contractions. °· Braxton Hicks contractions are usually shorter, weaker, farther apart, and less regular than true labor contractions. True labor contractions usually become progressively stronger and regular, and they become more frequent. °· Manage discomfort from Braxton Hicks contractions  by changing position, resting in a warm bath, drinking plenty of water, or practicing deep breathing. °This information is not intended to replace advice given to you by your health care provider. Make sure you discuss any questions you have with your health care provider. °Document Revised: 07/14/2017 Document Reviewed: 12/15/2016 °Elsevier Patient Education © 2020 [employer] ° °

## 2020-08-11 NOTE — ED Triage Notes (Signed)
Emergency Medicine Provider OB Triage Evaluation Note  Maria [name] is a 39 y.o. female, G2P0010, at [redacted]w[redacted]d gestation who presents to the emergency department with complaints of abdominal cramping. The patient reports sudden onset lower abdominal cramping that she states feels like contractions that began approximately 40 minutes prior to arrival. She checked her blood pressure at home it was elevated, 150s over 80s. She denies feeling a gush of fluid or having the urge to push. She has had some slight clear vaginal discharge, but no vaginal bleeding, vomiting, or fevers. Reports that she has not had her urine checked by her OB/GYN in several weeks, but at that time had no protein in her urine. She has no history of gestational hypertension.  Review of  Systems  Positive: Abdominal pain, vaginal discharge Negative: Vaginal fluid, vaginal bleeding, fever, vomiting  Physical Exam  BP (!) 140/99 (BP Location: Left Arm)   Pulse 73   Temp 98.5 F (36.9 C) (Oral)   Resp 20   LMP 12/18/2019   SpO2 97%  General: Awake, no distress  HEENT: Atraumatic  Resp: Normal effort  Cardiac: Normal rate Abd: Gravid, nontender  MSK: Moves all extremities without difficulty Neuro: Speech clear  Medical Decision Making  Pt evaluated for pregnancy concern and is stable for transfer to MAU. Pt is in agreement with plan for transfer.  6:21 AM Discussed with MAU APP, Edd Arbour, who accepts patient in transfer.  Clinical Impression  No diagnosis found.     Frederik Pear A, PA-C 08/11/20 [phone]

## 2020-08-11 NOTE — MAU Provider Note (Addendum)
History     CSN: [id number]  Arrival date and time: 08/11/20 0551   None    Chief Complaint  Patient presents with   Laboring   Abdominal Pain   HPI  Maria [name] is a 39 y.o. G2P0010 at [redacted]w[redacted]d who presents to MAU with chief complaint of preterm contractions, new onset at 0300 today. Patient describes her pain as "cramping" and states her pain changed to q 5 minute intervals at 0430. She thinks she may have taken Tylenol at 0330 but is not sure of the timing or the dosage. She denies vaginal bleeding, DFM, LOF.   Patient receives care with [facility].  She is scheduled for serial MFM growth scantd/t AMA. Her next MFM scan is tomorrow. Her next OB appointment is Thursday.   OB History    Gravida  2   Para  0   Term  0   Preterm      AB  1   Living  0     SAB  1   IAB  0   Ectopic      Multiple      Live Births              Past Medical History:  Diagnosis Date   Depression    Endometriosis 1/12   Laps   Herpes    Takes Valtrex daily; no history of outbreak   History of chlamydia    Infertility, female    Reflux     Past Surgical History:  Procedure Laterality Date   CHOLECYSTECTOMY  2007   LAPAROSCOPY  1/12   Rt ov cyst and endometriosis    Family History  Problem Relation Age of Onset   Cancer Father        kidney and lung   Kidney disease Mother    Heart disease Mother    Hypertension Mother    Alcohol abuse Mother        recovered   Heart disease Maternal Grandmother    Heart disease Maternal Grandfather     Social History   Tobacco Use   Smoking status: Former Smoker    Packs/day: 0.25    Years: 1.00    Pack years: 0.25    Types: Cigarettes   Smokeless tobacco: Never Used  Building services engineer Use: Never used  Substance Use Topics   Alcohol use: Yes    Alcohol/week: 1.0 standard drink    Types: 1 Standard drinks or equivalent per week   Drug use: No    Allergies: No Known Allergies  Medications  Prior to Admission  Medication Sig Dispense Refill Last Dose   acetaminophen (TYLENOL) 500 MG tablet Take 500 mg by mouth every 6 (six) hours as needed.   08/10/2020 at Unknown time   famotidine (PEPCID) 20 MG tablet Take 20 mg by mouth 2 (two) times daily.   08/10/2020 at Unknown time   Prenatal Vit-Fe Fumarate-FA (PRENATAL VITAMIN PO) Take by mouth.   08/10/2020 at Unknown time   zolpidem (AMBIEN) 10 MG tablet Take 10 mg by mouth at bedtime as needed for sleep.   08/10/2020 at Unknown time   valACYclovir (VALTREX) 1000 MG tablet Take 1 tablet (1,000 mg total) by mouth daily. (Patient not taking: No sig reported) 30 tablet 1     Review of Systems  Gastrointestinal: Positive for abdominal pain.  All other systems reviewed and are negative.  Physical Exam   Blood pressure 138/88, pulse 64, temperature  98.5 F (36.9 C), temperature source Oral, resp. rate 20, height 5\' 3"  (1.6 m), weight 83.5 kg, last menstrual period 12/18/2019, SpO2 97 %.  Physical Exam Vitals and nursing note reviewed. Exam conducted with a chaperone present.  Constitutional:      Appearance: She is well-developed.  Cardiovascular:     Rate and Rhythm: Normal rate.     Heart sounds: Normal heart sounds.  Pulmonary:     Effort: Pulmonary effort is normal.     Breath sounds: Normal breath sounds.  Abdominal:     Palpations: Abdomen is soft.     Tenderness: There is no abdominal tenderness. There is no right CVA tenderness or left CVA tenderness.     Comments: Gravid  Skin:    General: Skin is warm.     Capillary Refill: Capillary refill takes less than 2 seconds.  Neurological:     Mental Status: She is alert and oriented to person, place, and time.     MAU Course  Procedures  --Report received from 02/17/2020, CNM  --Reactive tracing: baseline 150, mod var, + 15 x 15 accles, no decels  --Toco: UI, occasional contractions. Pt verbalizing contractions q 4 min  --Day shift Tatanisha Cuthbert CNM at bedside at  [phone]. No contractions noted on monitor but patient using focused breathing to cope with pain. Will initiate treatment for preterm contractions, orders placed. Fluid bolus deferred due to elevated BP. Will reconsider if no relief from PO medications  --Patient given NST button at 0830, asked to push button each time she feels a contraction  --CNM returned to bedside at 0920. Patient states no improvement in discomfort s/p Flexeril, Tylenol and Procardia x 3. Declines 9892 IV fluid bolus. Current pain score 2/10 "cramps" but "really bad when they come but don't last very long". Reports most recent pain at 0909 "when I got that last Procardia". Shared normal PEC labs all normal. Improvement in blood pressure likely side effect of Procardia admininstration. Initial cervical exam at 0715 with , CNM. Will recheck at 3 hours per ACOG recommendations. Patient and husband verbalize they are comfortable with this plan  --CNM at bedside at 1020 to recheck cervix, s/p 3 hours from previous exam. Patient confirms she has not felt a contraction since about 0915. Wincing when repositioned to low Fowler's for cervical exam. Now 1/50 but patient endorsing significant improvement in pain score  --Discussed with Dr. 2/50. In setting of GA of [redacted]w[redacted]d, MFM appointment tomorrow and office appointment Thursday, will give BMZ, discharge home with Procardia as written and strict return precautions. Patient and support person verbalize they feel comfortable with this plan, able to teach back return precautions  Patient Vitals for the past 24 hrs:  BP Temp Temp src Pulse Resp SpO2 Height Weight  08/11/20 1109 114/64 -- -- 76 19 -- -- --  08/11/20 1016 113/67 -- -- 86 -- -- -- --  08/11/20 1001 119/60 -- -- 75 -- -- -- --  08/11/20 0946 103/68 -- -- 87 -- -- -- --  08/11/20 0930 110/62 -- -- 90 -- -- -- --  08/11/20 0916 112/65 -- -- (!) 111 -- -- -- --  08/11/20 0909 120/81 -- -- -- -- -- -- --  08/11/20 0846 122/64  -- -- 62 -- -- -- --  08/11/20 0831 122/66 -- -- 63 -- -- -- --  08/11/20 0816 (!) 146/90 -- -- 66 -- -- -- --  08/11/20 0800 138/88 -- -- 64 -- -- -- --  08/11/20 0724 122/78 -- -- 75 -- -- -- --  08/11/20 0704 (!) 137/94 -- -- 81 -- -- -- --  08/11/20 0642 124/86 98.5 F (36.9 C) Oral 79 20 -- 5\' 3"  (1.6 m) 83.5 kg  08/11/20 0558 (!) 140/99 98.5 F (36.9 C) Oral 73 20 97 % -- --   Results for orders placed or performed during the hospital encounter of 08/11/20 (from the past 24 hour(s))  Urinalysis, Routine w reflex microscopic Urine, Clean Catch     Status: Abnormal   Collection Time: 08/11/20  7:00 AM  Result Value Ref Range   Color, Urine YELLOW YELLOW   APPearance HAZY (A) CLEAR   Specific Gravity, Urine 1.020 1.005 - 1.030   pH 5.0 5.0 - 8.0   Glucose, UA NEGATIVE NEGATIVE mg/dL   Hgb urine dipstick NEGATIVE NEGATIVE   Bilirubin Urine NEGATIVE NEGATIVE   Ketones, ur 5 (A) NEGATIVE mg/dL   Protein, ur NEGATIVE NEGATIVE mg/dL   Nitrite NEGATIVE NEGATIVE   Leukocytes,Ua NEGATIVE NEGATIVE  Protein / creatinine ratio, urine     Status: None   Collection Time: 08/11/20  7:00 AM  Result Value Ref Range   Creatinine, Urine 165.94 mg/dL   Total Protein, Urine 12 mg/dL   Protein Creatinine Ratio 0.07 0.00 - 0.15 mg/mg[Cre]  Wet prep, genital     Status: Abnormal   Collection Time: 08/11/20  7:22 AM  Result Value Ref Range   Yeast Wet Prep HPF POC NONE SEEN NONE SEEN   Trich, Wet Prep NONE SEEN NONE SEEN   Clue Cells Wet Prep HPF POC NONE SEEN NONE SEEN   WBC, Wet Prep HPF POC FEW (A) NONE SEEN   Sperm NONE SEEN   CBC     Status: Abnormal   Collection Time: 08/11/20  7:57 AM  Result Value Ref Range   WBC 10.7 (H) 4.0 - 10.5 K/uL   RBC 3.98 3.87 - 5.11 MIL/uL   Hemoglobin 13.1 12.0 - 15.0 g/dL   HCT 08/13/20 40.9 - 81.1 %   MCV 92.5 80.0 - 100.0 fL   MCH 32.9 26.0 - 34.0 pg   MCHC 35.6 30.0 - 36.0 g/dL   RDW 91.4 78.2 - 95.6 %   Platelets 238 150 - 400 K/uL   nRBC 0.0 0.0 -  0.2 %  Comprehensive metabolic panel     Status: Abnormal   Collection Time: 08/11/20  7:57 AM  Result Value Ref Range   Sodium 137 135 - 145 mmol/L   Potassium 4.2 3.5 - 5.1 mmol/L   Chloride 106 98 - 111 mmol/L   CO2 21 (L) 22 - 32 mmol/L   Glucose, Bld 86 70 - 99 mg/dL   BUN 9 6 - 20 mg/dL   Creatinine, Ser 08/13/20 0.44 - 1.00 mg/dL   Calcium 9.1 8.9 - 0.86 mg/dL   Total Protein 6.2 (L) 6.5 - 8.1 g/dL   Albumin 2.8 (L) 3.5 - 5.0 g/dL   AST 18 15 - 41 U/L   ALT 12 0 - 44 U/L   Alkaline Phosphatase 79 38 - 126 U/L   Total Bilirubin 0.3 0.3 - 1.2 mg/dL   GFR, Estimated 57.8 >46 mL/min   Anion gap 10 5 - 15   Meds ordered this encounter  Medications   acetaminophen (TYLENOL) tablet 650 mg   cyclobenzaprine (FLEXERIL) tablet 10 mg   NIFEdipine (PROCARDIA) capsule 10 mg   NIFEdipine (PROCARDIA-XL/NIFEDICAL-XL) 30 MG 24 hr tablet    Sig: Take  1 tablet (30 mg total) by mouth in the morning and at bedtime for 5 days. Can take up to twice a day as needed for symptomatic contractions    Dispense:  10 tablet    Refill:  0    Order Specific Question:   Supervising Provider    Answer:   Reva BoresPRATT, TANYA S [[id number]]   Assessment and Plan  --39 y.o. G2P0010 at 418w6d  --Reactive tracing, cervix 1cm --S/p BMZ 1 of 2 at 1049 in MAU --Discharge home in stable condition, care coordinated with Dr. Shawnie PonsPratt  F/U: --MFM appointment tomorrow 08/12/2020 --Patient to return to MAU for BMZ 2 of 2 after MFM appointment --[facility] 08/13/2020  Calvert CantorSamantha C Rainn Zupko, CNM 08/11/2020, 8:52 PM

## 2020-08-11 NOTE — MAU Note (Signed)
PT SAY SHE WOKE AT 0300- WITH BAD CRAMPS- CONSTANT -  THEN  AT 0430- Q5 MIN - CRAMPS . NOW - IN WAVES.  [facility] [place].  LAST SEX- 1 WEEK AGO

## 2020-08-11 NOTE — ED Triage Notes (Signed)
Pt is a G1P0, pt states she is [redacted] weeks pregnant, having abd pain and contractions every 5 minutes, denies ROM or vaginal bleeding.

## 2020-08-12 ENCOUNTER — Inpatient Hospital Stay (EMERGENCY_DEPARTMENT_HOSPITAL)
Admission: AD | Admit: 2020-08-12 | Discharge: 2020-08-12 | Disposition: A | Discharge status: Home / Self Care | Payer: BC Managed Care – PPO | Source: Home / Self Care | Attending: Family Medicine | Admitting: Family Medicine

## 2020-08-12 ENCOUNTER — Other Ambulatory Visit: Payer: Self-pay | Admitting: Obstetrics

## 2020-08-12 ENCOUNTER — Other Ambulatory Visit: Payer: Self-pay

## 2020-08-12 ENCOUNTER — Ambulatory Visit (HOSPITAL_BASED_OUTPATIENT_CLINIC_OR_DEPARTMENT_OTHER): Payer: BC Managed Care – PPO

## 2020-08-12 ENCOUNTER — Encounter (HOSPITAL_COMMUNITY): Payer: Self-pay | Admitting: Obstetrics & Gynecology

## 2020-08-12 ENCOUNTER — Other Ambulatory Visit: Payer: Self-pay | Admitting: *Deleted

## 2020-08-12 ENCOUNTER — Encounter: Payer: Self-pay | Admitting: *Deleted

## 2020-08-12 ENCOUNTER — Ambulatory Visit: Payer: BC Managed Care – PPO | Admitting: *Deleted

## 2020-08-12 ENCOUNTER — Inpatient Hospital Stay (HOSPITAL_COMMUNITY)
Admission: AD | Admit: 2020-08-12 | Discharge: 2020-08-14 | DRG: 806 | Disposition: A | Discharge status: Home / Self Care | Payer: BC Managed Care – PPO | Attending: Obstetrics & Gynecology | Admitting: Obstetrics & Gynecology

## 2020-08-12 DIAGNOSIS — O26893 Other specified pregnancy related conditions, third trimester: Secondary | ICD-10-CM | POA: Insufficient documentation

## 2020-08-12 DIAGNOSIS — A6000 Herpesviral infection of urogenital system, unspecified: Secondary | ICD-10-CM | POA: Diagnosis present

## 2020-08-12 DIAGNOSIS — Z8279 Family history of other congenital malformations, deformations and chromosomal abnormalities: Secondary | ICD-10-CM | POA: Diagnosis not present

## 2020-08-12 DIAGNOSIS — Z87891 Personal history of nicotine dependence: Secondary | ICD-10-CM

## 2020-08-12 DIAGNOSIS — O09523 Supervision of elderly multigravida, third trimester: Secondary | ICD-10-CM | POA: Diagnosis not present

## 2020-08-12 DIAGNOSIS — O9832 Other infections with a predominantly sexual mode of transmission complicating childbirth: Secondary | ICD-10-CM | POA: Diagnosis present

## 2020-08-12 DIAGNOSIS — O1490 Unspecified pre-eclampsia, unspecified trimester: Secondary | ICD-10-CM

## 2020-08-12 DIAGNOSIS — O365990 Maternal care for other known or suspected poor fetal growth, unspecified trimester, not applicable or unspecified: Secondary | ICD-10-CM | POA: Diagnosis present

## 2020-08-12 DIAGNOSIS — O365930 Maternal care for other known or suspected poor fetal growth, third trimester, not applicable or unspecified: Secondary | ICD-10-CM

## 2020-08-12 DIAGNOSIS — Z362 Encounter for other antenatal screening follow-up: Secondary | ICD-10-CM

## 2020-08-12 DIAGNOSIS — O4703 False labor before 37 completed weeks of gestation, third trimester: Secondary | ICD-10-CM | POA: Insufficient documentation

## 2020-08-12 DIAGNOSIS — Z3A34 34 weeks gestation of pregnancy: Secondary | ICD-10-CM

## 2020-08-12 DIAGNOSIS — O4700 False labor before 37 completed weeks of gestation, unspecified trimester: Secondary | ICD-10-CM

## 2020-08-12 DIAGNOSIS — R030 Elevated blood-pressure reading, without diagnosis of hypertension: Secondary | ICD-10-CM | POA: Insufficient documentation

## 2020-08-12 DIAGNOSIS — Z20822 Contact with and (suspected) exposure to covid-19: Secondary | ICD-10-CM | POA: Diagnosis present

## 2020-08-12 DIAGNOSIS — B009 Herpesviral infection, unspecified: Secondary | ICD-10-CM | POA: Diagnosis present

## 2020-08-12 DIAGNOSIS — Z79899 Other long term (current) drug therapy: Secondary | ICD-10-CM | POA: Insufficient documentation

## 2020-08-12 DIAGNOSIS — O09519 Supervision of elderly primigravida, unspecified trimester: Secondary | ICD-10-CM

## 2020-08-12 MED ORDER — SODIUM CHLORIDE 0.9 % IV SOLN
2.0000 g | Freq: Four times a day (QID) | INTRAVENOUS | Status: DC
  Filled 2020-08-12: qty 2000

## 2020-08-12 MED ORDER — OXYCODONE-ACETAMINOPHEN 5-325 MG PO TABS: 2.0000 | ORAL_TABLET | ORAL | Status: DC | PRN

## 2020-08-12 MED ORDER — ACETAMINOPHEN 325 MG PO TABS: 650.0000 mg | ORAL_TABLET | ORAL | Status: DC | PRN

## 2020-08-12 MED ORDER — OXYTOCIN BOLUS FROM INFUSION
333.0000 mL | Freq: Once | INTRAVENOUS | Status: AC
  Administered 2020-08-13: 333 mL via INTRAVENOUS

## 2020-08-12 MED ORDER — HYDROXYZINE HCL 50 MG PO TABS
25.0000 mg | ORAL_TABLET | Freq: Once | ORAL | Status: DC
  Filled 2020-08-12: qty 1

## 2020-08-12 MED ORDER — OXYCODONE-ACETAMINOPHEN 5-325 MG PO TABS: 1.0000 | ORAL_TABLET | ORAL | Status: DC | PRN

## 2020-08-12 MED ORDER — OXYTOCIN-SODIUM CHLORIDE 30-0.9 UT/500ML-% IV SOLN
2.5000 [IU]/h | INTRAVENOUS | Status: DC
  Filled 2020-08-12: qty 500

## 2020-08-12 MED ORDER — LACTATED RINGERS IV SOLN: 500.0000 mL | INTRAVENOUS | Status: DC | PRN

## 2020-08-12 MED ORDER — ONDANSETRON HCL 4 MG/2ML IJ SOLN: 4.0000 mg | Freq: Four times a day (QID) | INTRAMUSCULAR | Status: DC | PRN

## 2020-08-12 MED ORDER — LIDOCAINE HCL (PF) 1 % IJ SOLN
30.0000 mL | INTRAMUSCULAR | Status: AC | PRN
  Administered 2020-08-13: 30 mL via SUBCUTANEOUS
  Filled 2020-08-12: qty 30

## 2020-08-12 MED ORDER — BETAMETHASONE SOD PHOS & ACET 6 (3-3) MG/ML IJ SUSP
12.0000 mg | Freq: Once | INTRAMUSCULAR | Status: AC
  Administered 2020-08-12: 12 mg via INTRAMUSCULAR

## 2020-08-12 MED ORDER — FENTANYL CITRATE (PF) 100 MCG/2ML IJ SOLN
50.0000 ug | Freq: Once | INTRAMUSCULAR | Status: DC
  Filled 2020-08-12: qty 2

## 2020-08-12 MED ORDER — LACTATED RINGERS IV SOLN: INTRAVENOUS | Status: DC

## 2020-08-12 MED ORDER — SOD CITRATE-CITRIC ACID 500-334 MG/5ML PO SOLN: 30.0000 mL | ORAL | Status: DC | PRN

## 2020-08-12 NOTE — MAU Note (Signed)
Here for 2nd dose of Betamethazone. Continues to contract, woke her at 0300, ctxs were q 5, took Procardia, now every 20-73min. Since yesterday, has had spotty mucous d/c non-stop.

## 2020-08-12 NOTE — MAU Provider Note (Signed)
Obstetric Attending MAU Note  Chief Complaint:  No chief complaint on file.   Event Date/Time   First Provider Initiated Contact with Patient 08/12/20 1155     HPI: Maria [name] is a 39 y.o. G2P0010 at [redacted]w[redacted]d who presents to maternity admissions for follow-up betamethasone.  Patient was seen previously for preterm contractions yesterday with minimal cervical change from 0-1.  The patient reports ongoing contractions which improved with Procardia although overnight she was contracting every 5 to 10 minutes.  She took a dose this morning her contractions have now spaced out to every 20 minutes.  Additionally her blood pressure was elevated yesterday here but was normal at MFM today.  On ultrasound today her baby was noted to be in the 9th percentile and she is for follow-up weekly BPP's.. Denies leakage of fluid, has some mucousy discharge with small amount of spotting related to exam yesterday but denies frank vaginal bleeding. Good fetal movement.   Pregnancy Course: Receives care at [facility] for [facility] Patient Active Problem List   Diagnosis Date Noted  . Pregnancy affected by fetal growth restriction 08/12/2020  . Family history of congenital heart defect 04/30/2020  . AMA (advanced maternal age) primigravida 35+ 03/13/2020  . Supervision of normal pregnancy 02/20/2020  . Herpes   . HPV test positive 10/19/2014  . Endometriosis 03/15/2011  . Bacterial vaginosis 03/15/2011    Past Medical History:  Diagnosis Date  . Depression   . Endometriosis 1/12   Laps  . Herpes    Takes Valtrex daily; no history of outbreak  . History of chlamydia   . Infertility, female   . Reflux     OB History  Gravida Para Term Preterm AB Living  2 0 0   1 0  SAB IAB Ectopic Multiple Live Births  1 0          # Outcome Date GA Lbr Len/2nd Weight Sex Delivery Anes PTL Lv  2 Current           1 SAB             Past Surgical History:  Procedure Laterality Date  .  CHOLECYSTECTOMY  2007  . LAPAROSCOPY  1/12   Rt ov cyst and endometriosis    Family History: Family History  Problem Relation Age of Onset  . Cancer Father        kidney and lung  . Kidney disease Mother   . Heart disease Mother   . Hypertension Mother   . Alcohol abuse Mother        recovered  . Heart disease Maternal Grandmother   . Heart disease Maternal Grandfather     Social History: Social History   Tobacco Use  . Smoking status: Former Smoker    Packs/day: 0.25    Years: 1.00    Pack years: 0.25    Types: Cigarettes  . Smokeless tobacco: Never Used  Vaping Use  . Vaping Use: Never used  Substance Use Topics  . Alcohol use: Yes    Alcohol/week: 1.0 standard drink    Types: 1 Standard drinks or equivalent per week  . Drug use: No    Allergies: No Known Allergies  Medications Prior to Admission  Medication Sig Dispense Refill Last Dose  . acetaminophen (TYLENOL) 500 MG tablet Take 500 mg by mouth every 6 (six) hours as needed.     . famotidine (PEPCID) 20 MG tablet Take 20 mg by mouth 2 (two) times  daily.     Marland Kitchen NIFEdipine (PROCARDIA-XL/NIFEDICAL-XL) 30 MG 24 hr tablet Take 1 tablet (30 mg total) by mouth in the morning and at bedtime for 5 days. Can take up to twice a day as needed for symptomatic contractions 10 tablet 0   . Prenatal Vit-Fe Fumarate-FA (PRENATAL VITAMIN PO) Take by mouth.     . valACYclovir (VALTREX) 1000 MG tablet Take 1 tablet (1,000 mg total) by mouth daily. 30 tablet 1   . zolpidem (AMBIEN) 10 MG tablet Take 10 mg by mouth at bedtime as needed for sleep.       ROS: Pertinent findings in history of present illness.  Physical Exam  Last menstrual period 12/18/2019. CONSTITUTIONAL: Well-developed, well-nourished female in no acute distress.  HENT:  Normocephalic, atraumatic, External right and left ear normal. Oropharynx is clear and moist EYES: Conjunctivae and EOM are normal. Pupils are equal, round, and reactive to light. No scleral  icterus.  NECK: Normal range of motion, supple, no masses SKIN: Skin is warm and dry. No rash noted. Not diaphoretic. No erythema. No pallor. NEUROLGIC: Alert and oriented to person, place, and time. Normal reflexes, muscle tone coordination. No cranial nerve deficit noted. PSYCHIATRIC: Normal mood and affect. Normal behavior. Normal judgment and thought content. CARDIOVASCULAR: Normal heart rate noted, regular rhythm RESPIRATORY: Effort and breath sounds normal, no problems with respiration noted ABDOMEN: Soft, nontender, nondistended, gravid appropriate for gestational age MUSCULOSKELETAL: Normal range of motion. No edema and no tenderness. 2+ distal pulses.   Labs: No results found for this or any previous visit (from the past 24 hour(s)).  Imaging:  [place] MFM OB FOLLOW UP  Result Date: 07/24/2020 ----------------------------------------------------------------------  OBSTETRICS REPORT                       (Signed Final 07/24/2020 06:01 pm) ---------------------------------------------------------------------- Patient Info  ID #:       [id number]                          D.O.B.:  June 11, 1981 (39 yrs)  Name:       Maria [name]                  Visit Date: 07/24/2020 02:45 pm ---------------------------------------------------------------------- Performed By  Attending:        Ma Rings MD         Ref. Address:     [address]                                                             [place], [place]  [address]  Performed By:     Eden Lathe BS      [facility]  Referred By:      Lawernce Pitts CNM  ---------------------------------------------------------------------- Orders  #  Description                           Code        Ordered By  1  [place] MFM OB FOLLOW UP                   [phone]    YU FANG ----------------------------------------------------------------------  #  Order #                     Accession #                Episode #  1  [id number]                   [id number]                 [id number] ---------------------------------------------------------------------- Indications  Advanced maternal age multigravida 42+,        O43.523  third trimester (39)  Encounter for other antenatal screening        Z36.2  follow-up  Family history of congenital anomaly (FOB-     Z82.79  Bicuspid aortic valve)  Low Risk Nips  [redacted] weeks gestation of pregnancy                Z3A.31 ---------------------------------------------------------------------- Fetal Evaluation  Num Of Fetuses:         1  Cardiac Activity:       Observed  Presentation:           Cephalic  Placenta:               Anterior  P. Cord Insertion:      Visualized  Amniotic Fluid  AFI FV:      Within normal limits  AFI Sum(cm)     %Tile       Largest Pocket(cm)  12.4            34          4.2  RUQ(cm)  RLQ(cm)       LUQ(cm)        LLQ(cm)  4.2           2.6           2.4            3.2 ---------------------------------------------------------------------- Biometry  BPD:      81.5  mm     G. Age:  32w 5d         82  %    CI:        76.92   %    70 - 86                                                          FL/HC:      18.5   %    19.3 - 21.3  HC:      294.3  mm     G. Age:  32w 3d         46  %    HC/AC:      1.13        0.96 - 1.17  AC:      261.1  mm     G. Age:  30w 2d         19  %    FL/BPD:     66.6   %    71 - 87  FL:       54.3  mm     G. Age:  28w 5d        1.1  %    FL/AC:      20.8   %    20 - 24  HUM:      49.2  mm     G. Age:  29w 0d        < 5  %  LV:        4.2  mm  Est. FW:    1522  gm      3 lb 6 oz     11  %  ---------------------------------------------------------------------- OB History  Gravidity:    2         Term:   0        Prem:   0        SAB:   1  TOP:          0       Ectopic:  0        Living: 0 ---------------------------------------------------------------------- Gestational Age  LMP:           31w 2d        Date:  12/18/19                 EDD:   09/23/20  U/S Today:     31w 0d                                        EDD:   09/25/20  Best:          31w 2d     Det. By:  LMP  (12/18/19)          EDD:  09/23/20 ---------------------------------------------------------------------- Anatomy  Cranium:               Appears normal         Aortic Arch:            Previously seen  Cavum:                 Appears normal         Ductal Arch:            Previously seen  Ventricles:            Appears normal         Diaphragm:              Appears normal  Choroid Plexus:        Appears normal         Stomach:                Appears normal, left                                                                        sided  Cerebellum:            Appears normal         Abdomen:                Appears normal  Posterior Fossa:       Previously seen        Abdominal Wall:         Previously seen  Nuchal Fold:           Previously seen        Cord Vessels:           Previously seen  Face:                  Appears normal         Kidneys:                Appear normal                         (orbits and profile)  Lips:                  Appears normal         Bladder:                Appears normal  Thoracic:              Appears normal         Spine:                  Previously seen  Heart:                 Appears normal         Upper Extremities:      Previously seen                         (4CH, axis, and                         situs)  RVOT:                  Appears normal         Lower Extremities:      Previously seen  LVOT:                  Appears normal  Other:  Female gender previously seen. Heels and 5th digit previously           visualized. Nasal bone previously visualized. 3VV and 3VTV          previously visualized. ---------------------------------------------------------------------- Doppler - Fetal Vessels  Umbilical Artery   S/D     %tile      RI    %tile                             ADFV    RDFV   3.06       69    0.67       72                                No      No ---------------------------------------------------------------------- Cervix Uterus Adnexa  Cervix  Not visualized (advanced GA >24wks)  Uterus  No abnormality visualized.  Right Ovary  Within normal limits.  Left Ovary  Within normal limits.  Cul De Sac  No free fluid seen.  Adnexa  No abnormality visualized. ---------------------------------------------------------------------- Comments  This patient was seen for a follow up growth scan due to  advanced maternal age.  She denies any problems since her  last exam.  She was informed that the fetal growth and amniotic fluid  level appears appropriate for her gestational age.  The overall  EFW obtained today measured at the 11th percentile for her  gestational age.  As the overall EFW measures in the lower normal range, a  follow-up growth scan was scheduled in 3 weeks. ----------------------------------------------------------------------                   Ma RingsVictor Fang, MD Electronically Signed Final Report   07/24/2020 06:01 pm ----------------------------------------------------------------------   MAU Course: Status post betamethasone which she tolerated well.  Assessment: 1. Preterm contractions   2. Pregnancy affected by fetal growth restriction     Plan: Discharge home Labor precautions and fetal kick counts reviewed May use Procardia twice at night if needed Discussed elevated blood pressure, fetal growth restriction, preterm labor/contractions, gestational age at which we stop with ongoing aggressive toco lysis, and possible NICU issues. Follow up with OB provider   Follow-up Information     [facility] [place] [facility].   Specialty: Obstetrics and Gynecology Why: keep next appointment Contact information: [address], [address] [place] [id number] [phone]              Allergies as of 08/12/2020   No Known Allergies     Medication List    TAKE these medications   acetaminophen 500 MG tablet Commonly known as: TYLENOL Take 500 mg by mouth every 6 (six) hours as needed.   famotidine 20 MG tablet Commonly known as: PEPCID Take 20 mg by mouth 2 (two) times daily.   NIFEdipine 30 MG 24 hr tablet Commonly known as: PROCARDIA-XL/NIFEDICAL-XL Take 1 tablet (30 mg total) by mouth in the morning and at bedtime for 5 days.  Can take up to twice a day as needed for symptomatic contractions   PRENATAL VITAMIN PO Take by mouth.   valACYclovir 1000 MG tablet Commonly known as: Valtrex Take 1 tablet (1,000 mg total) by mouth daily.   zolpidem 10 MG tablet Commonly known as: AMBIEN Take 10 mg by mouth at bedtime as needed for sleep.       Reva Bores, MD 08/12/2020 12:11 PM

## 2020-08-12 NOTE — Discharge Instructions (Signed)
Braxton Hicks Contractions °Contractions of the uterus can occur throughout pregnancy, but they are not always a sign that you are in labor. You may have practice contractions called Braxton Hicks contractions. These false labor contractions are sometimes confused with true labor. °What are Braxton Hicks contractions? °Braxton Hicks contractions are tightening movements that occur in the muscles of the uterus before labor. Unlike true labor contractions, these contractions do not result in opening (dilation) and thinning of the cervix. Toward the end of pregnancy (32-34 weeks), Braxton Hicks contractions can happen more often and may become stronger. These contractions are sometimes difficult to tell apart from true labor because they can be very uncomfortable. You should not feel embarrassed if you go to the hospital with false labor. °Sometimes, the only way to tell if you are in true labor is for your health care provider to look for changes in the cervix. The health care provider will do a physical exam and may monitor your contractions. If you are not in true labor, the exam should show that your cervix is not dilating and your water has not broken. °If there are no other health problems associated with your pregnancy, it is completely safe for you to be sent home with false labor. You may continue to have Braxton Hicks contractions until you go into true labor. °How to tell the difference between true labor and false labor °True labor °· Contractions last 30-70 seconds. °· Contractions become very regular. °· Discomfort is usually felt in the top of the uterus, and it spreads to the lower abdomen and low back. °· Contractions do not go away with walking. °· Contractions usually become more intense and increase in frequency. °· The cervix dilates and gets thinner. °False labor °· Contractions are usually shorter and not as strong as true labor contractions. °· Contractions are usually irregular. °· Contractions  are often felt in the front of the lower abdomen and in the groin. °· Contractions may go away when you walk around or change positions while lying down. °· Contractions get weaker and are shorter-lasting as time goes on. °· The cervix usually does not dilate or become thin. °Follow these instructions at home: ° °· Take over-the-counter and prescription medicines only as told by your health care provider. °· Keep up with your usual exercises and follow other instructions from your health care provider. °· Eat and drink lightly if you think you are going into labor. °· If Braxton Hicks contractions are making you uncomfortable: °? Change your position from lying down or resting to walking, or change from walking to resting. °? Sit and rest in a tub of warm water. °? Drink enough fluid to keep your urine pale yellow. Dehydration may cause these contractions. °? Do slow and deep breathing several times an hour. °· Keep all follow-up prenatal visits as told by your health care provider. This is important. °Contact a health care provider if: °· You have a fever. °· You have continuous pain in your abdomen. °Get help right away if: °· Your contractions become stronger, more regular, and closer together. °· You have fluid leaking or gushing from your vagina. °· You pass blood-tinged mucus (bloody show). °· You have bleeding from your vagina. °· You have low back pain that you never had before. °· You feel your baby’s head pushing down and causing pelvic pressure. °· Your baby is not moving inside you as much as it used to. °Summary °· Contractions that occur before labor are   called Braxton Hicks contractions, false labor, or practice contractions. °· Braxton Hicks contractions are usually shorter, weaker, farther apart, and less regular than true labor contractions. True labor contractions usually become progressively stronger and regular, and they become more frequent. °· Manage discomfort from Braxton Hicks contractions  by changing position, resting in a warm bath, drinking plenty of water, or practicing deep breathing. °This information is not intended to replace advice given to you by your health care provider. Make sure you discuss any questions you have with your health care provider. °Document Revised: 07/14/2017 Document Reviewed: 12/15/2016 °Elsevier Patient Education © 2020 [employer] ° °

## 2020-08-12 NOTE — MAU Note (Signed)
..  Maria [name] is a 39 y.o. at [redacted]w[redacted]d here in MAU reporting: LOF at 2130 with clear fluid and pink-ish mucous. Reports contractions every 5 minutes. +FM. Patient states she was seen yesterday and was 1cm. Has gotten both doses of betamethasone.

## 2020-08-13 ENCOUNTER — Encounter (HOSPITAL_COMMUNITY): Payer: Self-pay | Admitting: Obstetrics & Gynecology

## 2020-08-13 ENCOUNTER — Encounter: Payer: BC Managed Care – PPO | Admitting: Obstetrics & Gynecology

## 2020-08-13 DIAGNOSIS — O1490 Unspecified pre-eclampsia, unspecified trimester: Secondary | ICD-10-CM

## 2020-08-13 DIAGNOSIS — Z3A34 34 weeks gestation of pregnancy: Secondary | ICD-10-CM

## 2020-08-13 LAB — COMPREHENSIVE METABOLIC PANEL
ALT: 13 U/L (ref 0–44)
AST: 27 U/L (ref 15–41)
Albumin: 3.5 g/dL (ref 3.5–5.0)
Alkaline Phosphatase: 95 U/L (ref 38–126)
Anion gap: 15 (ref 5–15)
BUN: 11 mg/dL (ref 6–20)
CO2: 17 mmol/L — ABNORMAL LOW (ref 22–32)
Calcium: 9.4 mg/dL (ref 8.9–10.3)
Chloride: 104 mmol/L (ref 98–111)
Creatinine, Ser: 0.76 mg/dL (ref 0.44–1.00)
GFR, Estimated: >60 mL/min (ref >60)
Glucose, Bld: 131 mg/dL — ABNORMAL HIGH (ref 70–99)
Potassium: 3.6 mmol/L (ref 3.5–5.1)
Sodium: 136 mmol/L (ref 135–145)
Total Bilirubin: 0.5 mg/dL (ref 0.3–1.2)
Total Protein: 7.2 g/dL (ref 6.5–8.1)

## 2020-08-13 LAB — CBC
HCT: 39.1 % (ref 36.0–46.0)
Hemoglobin: 13.9 g/dL (ref 12.0–15.0)
MCH: 33 pg (ref 26.0–34.0)
MCHC: 35.5 g/dL (ref 30.0–36.0)
MCV: 92.9 fL (ref 80.0–100.0)
Platelets: 288 10*3/uL (ref 150–400)
RBC: 4.21 MIL/uL (ref 3.87–5.11)
RDW: 13.2 % (ref 11.5–15.5)
WBC: 14.5 10*3/uL — ABNORMAL HIGH (ref 4.0–10.5)
nRBC: 0 % (ref 0.0–0.2)

## 2020-08-13 LAB — PROTEIN / CREATININE RATIO, URINE
Creatinine, Urine: 197.6 mg/dL
Protein Creatinine Ratio: 0.35 mg/mg{Cre} — ABNORMAL HIGH (ref 0.00–0.15)
Total Protein, Urine: 69 mg/dL

## 2020-08-13 LAB — RESP PANEL BY RT-PCR (FLU A&B, COVID) ARPGX2
Influenza A by PCR: NEGATIVE
Influenza B by PCR: NEGATIVE
SARS Coronavirus 2 by RT PCR: NEGATIVE

## 2020-08-13 LAB — TYPE AND SCREEN
ABO/RH(D): O POS
Antibody Screen: NEGATIVE

## 2020-08-13 LAB — RPR: RPR Ser Ql: NONREACTIVE

## 2020-08-13 MED ORDER — PRENATAL MULTIVITAMIN CH
1.0000 | ORAL_TABLET | Freq: Every day | ORAL | Status: DC
  Administered 2020-08-13 – 2020-08-14 (×2): 1 via ORAL
  Filled 2020-08-13 (×2): qty 1

## 2020-08-13 MED ORDER — DIBUCAINE (PERIANAL) 1 % EX OINT: 1.0000 "application " | TOPICAL_OINTMENT | CUTANEOUS | Status: DC | PRN

## 2020-08-13 MED ORDER — IBUPROFEN 600 MG PO TABS
600.0000 mg | ORAL_TABLET | Freq: Four times a day (QID) | ORAL | Status: DC
  Administered 2020-08-13 – 2020-08-14 (×5): 600 mg via ORAL
  Filled 2020-08-13 (×6): qty 1

## 2020-08-13 MED ORDER — WITCH HAZEL-GLYCERIN EX PADS: 1.0000 "application " | MEDICATED_PAD | CUTANEOUS | Status: DC | PRN

## 2020-08-13 MED ORDER — ONDANSETRON HCL 4 MG/2ML IJ SOLN: 4.0000 mg | INTRAMUSCULAR | Status: DC | PRN

## 2020-08-13 MED ORDER — DIPHENHYDRAMINE HCL 25 MG PO CAPS: 25.0000 mg | ORAL_CAPSULE | Freq: Four times a day (QID) | ORAL | Status: DC | PRN

## 2020-08-13 MED ORDER — BENZOCAINE-MENTHOL 20-0.5 % EX AERO
1.0000 "application " | INHALATION_SPRAY | CUTANEOUS | Status: DC | PRN
  Administered 2020-08-13: 1 via TOPICAL
  Filled 2020-08-13: qty 56

## 2020-08-13 MED ORDER — SENNOSIDES-DOCUSATE SODIUM 8.6-50 MG PO TABS
2.0000 | ORAL_TABLET | Freq: Every day | ORAL | Status: DC
  Administered 2020-08-14: 2 via ORAL
  Filled 2020-08-13: qty 2

## 2020-08-13 MED ORDER — COCONUT OIL OIL
1.0000 | TOPICAL_OIL | Status: DC | PRN
Start: 2020-08-13 — End: 2020-08-14

## 2020-08-13 MED ORDER — SIMETHICONE 80 MG PO CHEW: 80.0000 mg | CHEWABLE_TABLET | ORAL | Status: DC | PRN

## 2020-08-13 MED ORDER — ONDANSETRON HCL 4 MG PO TABS: 4.0000 mg | ORAL_TABLET | ORAL | Status: DC | PRN

## 2020-08-13 MED ORDER — TETANUS-DIPHTH-ACELL PERTUSSIS 5-2.5-18.5 LF-MCG/0.5 IM SUSY: 0.5000 mL | PREFILLED_SYRINGE | Freq: Once | INTRAMUSCULAR | Status: DC

## 2020-08-13 MED ORDER — AMLODIPINE BESYLATE 5 MG PO TABS
5.0000 mg | ORAL_TABLET | Freq: Every day | ORAL | Status: DC
  Administered 2020-08-13 – 2020-08-14 (×2): 5 mg via ORAL
  Filled 2020-08-13 (×2): qty 1

## 2020-08-13 MED ORDER — FENTANYL CITRATE (PF) 100 MCG/2ML IJ SOLN
INTRAMUSCULAR | Status: AC
  Administered 2020-08-13: 100 ug
  Filled 2020-08-13: qty 2

## 2020-08-13 MED ORDER — ZOLPIDEM TARTRATE 5 MG PO TABS
5.0000 mg | ORAL_TABLET | Freq: Every evening | ORAL | Status: DC | PRN
  Administered 2020-08-13 – 2020-08-14 (×2): 5 mg via ORAL
  Filled 2020-08-13 (×2): qty 1

## 2020-08-13 MED ORDER — ACETAMINOPHEN 325 MG PO TABS
650.0000 mg | ORAL_TABLET | Freq: Four times a day (QID) | ORAL | Status: DC
  Administered 2020-08-13 – 2020-08-14 (×6): 650 mg via ORAL
  Filled 2020-08-13 (×6): qty 2

## 2020-08-13 NOTE — Lactation Note (Signed)
This note was copied from a baby's chart. Lactation Consultation Note  Patient Name: Maria [name] DSKAJ'G Date: 08/13/2020 Reason for consult: Initial assessment;Primapara;1st time breastfeeding;NICU baby;Late-preterm 34-36.6wks;Infant < 6lbs Age:38 hours  0915 - 8115 - I conducted an initial lactation consult with Maria [name]. I set up her DEBP and thoroughly reviewed the [facility] NICU booklet. We discussed what to expect with pumping between days 1-3.  I set up her DEBP and helped her to initiate pumping. We practiced hand expression and noted colostrum. Maria [name] had positive breast changes in pregnancy. I recommended doing STS and oral care with her daughter in the NICU.  Size 24 flanges appeared to be appropriate at this time. I recommended that she pump 8-12 times a day on the initiate setting. I wrote up some suggested pumping times on her whiteboard based on when she initiated.  Maria [name] has a Spectra pump at home.  She would like to breast feed with the option to bottle feed baby. She asked if it were possible to do both, and I clarified guidance regarding the recommendations about no bottles or pacifiers in the first 3 weeks. We discussed that because her baby is a LPI, the guidance is slightly different, and we do recommended supplementation and pumping initially.   Maria [name] states that baby is showing good readiness cues to latch, and she would like to try to latch baby when she goes up to see her. I stated that lactation is available on the NICU floor, and she can call for assistance as needed.  Maternal Data Has patient been taught Hand Expression?: Yes Does the patient have breastfeeding experience prior to this delivery?: No   Interventions Interventions: Breast feeding basics reviewed;Hand express;DEBP  Lactation Tools Discussed/Used Tools: Pump Breast pump type: Double-Electric Breast Pump Pump Education: Setup, frequency, and cleaning;Milk Storage Initiated by::  HL Date initiated:: 08/13/20   Consult Status Consult Status: Follow-up Date: 08/14/20 Follow-up type: In-patient    Walker Shadow 08/13/2020, 9:54 AM

## 2020-08-13 NOTE — Discharge Summary (Signed)
Postpartum Discharge Summary  Date of Service updated12/31     Patient Name: Maria [name] DOB: [birth year] MRN: [id number]  Date of admission: 08/12/2020 Delivery date:08/13/2020  Delivering provider: Randa Ngo  Date of discharge: 08/13/2020  Admitting diagnosis: Preterm labor [O60.00] Intrauterine pregnancy: [redacted]w[redacted]d    Secondary diagnosis:  Active Problems:   Herpes   AMA (advanced maternal age) primigravida 35+   Family history of congenital heart defect   Pregnancy affected by fetal growth restriction   Preterm labor   Preeclampsia   Vaginal delivery  Additional problems: as noted above   Discharge diagnosis: Vaginal delivery                                       Post partum procedures:none Augmentation: N/A Complications: None  Hospital course: Onset of Labor With Vaginal Delivery      39y.o. yo G2P0010 at 38 6w1dasas admitted in Active Labor on 08/12/2020. Patient had an uncomplicated labor course as follows:  Membrane Rupture Time/Date:  ,  2130 on 08/12/20 Delivery Method:Vaginal, Spontaneous  Episiotomy: None  Lacerations:  Sulcus  Patient had an uncomplicated postpartum course.  She is ambulating, tolerating a regular diet, passing flatus, and urinating well. Patient is discharged home in stable condition on 08/13/20.  Newborn Data: Birth date:08/13/2020  Birth time:12:09 AM  Gender:Female  Living status:Living  Apgars:9 ,9  Weight:2050 g   Magnesium Sulfate received: No BMZ received: Yes (12/28 & 12/29 prior to admission to L&D) Rhophylac:N/A MMR:N/A T-DaP:Given prenatally Flu: Yes Transfusion:No  Physical exam  Vitals:   08/13/20 0047 08/13/20 0107 08/13/20 0116 08/13/20 0131  BP: 138/89 137/73 139/79 136/79  Pulse: 88 86 88 78  Resp: '18 18 18 18  ' Temp:  98.5 F (36.9 C)    TempSrc:  Oral    SpO2:      Weight:      Height:       General: alert and cooperative Lochia: appropriate Uterine Fundus: firm DVT Evaluation: No evidence  of DVT seen on physical exam. Labs: Lab Results  Component Value Date   WBC 14.5 (H) 08/12/2020   HGB 13.9 08/12/2020   HCT 39.1 08/12/2020   MCV 92.9 08/12/2020   PLT 288 08/12/2020   CMP Latest Ref Rng & Units 08/12/2020  Glucose 70 - 99 mg/dL 131(H)  BUN 6 - 20 mg/dL 11  Creatinine 0.44 - 1.00 mg/dL 0.76  Sodium 135 - 145 mmol/L 136  Potassium 3.5 - 5.1 mmol/L 3.6  Chloride 98 - 111 mmol/L 104  CO2 22 - 32 mmol/L 17(L)  Calcium 8.9 - 10.3 mg/dL 9.4  Total Protein 6.5 - 8.1 g/dL 7.2  Total Bilirubin 0.3 - 1.2 mg/dL 0.5  Alkaline Phos 38 - 126 U/L 95  AST 15 - 41 U/L 27  ALT 0 - 44 U/L 13   Edinburgh Score: No flowsheet data found.   After visit meds:  Allergies as of 08/14/2020   No Known Allergies     Medication List    STOP taking these medications   NIFEdipine 30 MG 24 hr tablet Commonly known as: PROCARDIA-XL/NIFEDICAL-XL     TAKE these medications   acetaminophen 500 MG tablet Commonly known as: TYLENOL Take 500 mg by mouth every 6 (six) hours as needed.   amLODipine 5 MG tablet Commonly known as: NORVASC Take 1 tablet (5 mg total) by  mouth daily.   benzocaine-Menthol 20-0.5 % Aero Commonly known as: DERMOPLAST Apply 1 application topically as needed for irritation (perineal discomfort).   famotidine 20 MG tablet Commonly known as: PEPCID Take 20 mg by mouth 2 (two) times daily.   ibuprofen 600 MG tablet Commonly known as: ADVIL Take 1 tablet (600 mg total) by mouth every 6 (six) hours.   PRENATAL VITAMIN PO Take by mouth.   valACYclovir 1000 MG tablet Commonly known as: Valtrex Take 1 tablet (1,000 mg total) by mouth daily.   zolpidem 10 MG tablet Commonly known as: AMBIEN Take 10 mg by mouth at bedtime as needed for sleep.        Discharge home in stable condition Infant Feeding: Breast + donor milk Infant Disposition:baby in NICU Discharge instruction: per After Visit Summary and Postpartum booklet. Activity: Advance as  tolerated. Pelvic rest for 6 weeks.  Diet: routine diet Future Appointments: Future Appointments  Date Time Provider [place]  08/13/2020 11:00 AM Guss Bunde, MD [facility]  08/18/2020 10:30 AM [facility] NURSE [facility]  08/18/2020 10:45 AM [facility] US4 [facility]  08/25/2020  9:30 AM [facility] NURSE [facility]  08/25/2020  9:45 AM [facility] NST [facility]  09/01/2020  3:30 PM [facility] NURSE [facility]  09/01/2020  3:45 PM [facility] US4 [facility] [place]   Follow up Visit: Message sent to [facility] clinic on 08/13/20 to schedule PP appt.  Please schedule this patient for a In person postpartum visit in 6 weeks with the following provider: Any provider. Additional Postpartum F/U:Postpartum Depression checkup and BP check 1 week  High risk pregnancy complicated by: Preeclampsia (diagnosed on admit to L&D), FGR, Preterm delivery, HSV, AMA Delivery mode:  Vaginal, Spontaneous  Anticipated Birth Control:  OCPs and Unsure   08/14/2020 Cherre Blanc, MD

## 2020-08-13 NOTE — H&P (Signed)
OBSTETRIC ADMISSION HISTORY AND PHYSICAL  Maria [name] is a 39 y.o. female G2P0010 with IUP at [redacted]w[redacted]d by LMP presenting for active preterm labor. She reports +FMs, No LOF, no VB, no blurry vision, headaches or peripheral edema, and RUQ pain.  She plans on breast feeding. She is undecided for birth control.  She received her prenatal care at [facility]   Dating: By LMP --->  Estimated Date of Delivery: 09/23/20  Sono:  @[redacted]w[redacted]d , CWD, normal anatomy, cephalic presentation, 1961g, 8% EFW  Prenatal History/Complications:  - family history of bicuspid aortic valve (normal fetal echo) - h/o HSV (on suppressive valtrex, no signs/symptoms of recent outbreaks) - fetal growth restriction (as noted above)  Past Medical History: Past Medical History:  Diagnosis Date  . Depression   . Endometriosis 1/12   Laps  . Herpes    Takes Valtrex daily; no history of outbreak  . History of chlamydia   . Infertility, female   . Reflux     Past Surgical History: Past Surgical History:  Procedure Laterality Date  . CHOLECYSTECTOMY  2007  . LAPAROSCOPY  1/12   Rt ov cyst and endometriosis    Obstetrical History: OB History    Gravida  2   Para  0   Term  0   Preterm      AB  1   Living  0     SAB  1   IAB  0   Ectopic      Multiple      Live Births              Social History Social History   Socioeconomic History  . Marital status: Married    Spouse name: Not on file  . Number of children: Not on file  . Years of education: Not on file  . Highest education level: Not on file  Occupational History  . Occupation: 3/12: [employer]  Tobacco Use  . Smoking status: Former Smoker    Packs/day: 0.25    Years: 1.00    Pack years: 0.25    Types: Cigarettes  . Smokeless tobacco: Never Used  Vaping Use  . Vaping Use: Never used  Substance and Sexual Activity  . Alcohol use: Not Currently    Alcohol/week: 1.0 standard drink    Types: 1 Standard  drinks or equivalent per week  . Drug use: No  . Sexual activity: Yes    Partners: Male    Birth control/protection: None  Other Topics Concern  . Not on file  Social History Narrative  . Not on file   Social Determinants of Health   Financial Resource Strain: Not on file  Food Insecurity: Not on file  Transportation Needs: Not on file  Physical Activity: Not on file  Stress: Not on file  Social Connections: Not on file    Family History: Family History  Problem Relation Age of Onset  . Cancer Father        kidney and lung  . Kidney disease Mother   . Heart disease Mother   . Hypertension Mother   . Alcohol abuse Mother        recovered  . Heart disease Maternal Grandmother   . Heart disease Maternal Grandfather     Allergies: No Known Allergies  Medications Prior to Admission  Medication Sig Dispense Refill Last Dose  . acetaminophen (TYLENOL) 500 MG tablet Take 500 mg by mouth every 6 (six) hours as  needed.   08/12/2020 at Unknown time  . NIFEdipine (PROCARDIA-XL/NIFEDICAL-XL) 30 MG 24 hr tablet Take 1 tablet (30 mg total) by mouth in the morning and at bedtime for 5 days. Can take up to twice a day as needed for symptomatic contractions 10 tablet 0 08/12/2020 at Unknown time  . Prenatal Vit-Fe Fumarate-FA (PRENATAL VITAMIN PO) Take by mouth.   08/11/2020 at Unknown time  . valACYclovir (VALTREX) 1000 MG tablet Take 1 tablet (1,000 mg total) by mouth daily. 30 tablet 1 08/12/2020 at Unknown time  . famotidine (PEPCID) 20 MG tablet Take 20 mg by mouth 2 (two) times daily.     Marland Kitchen zolpidem (AMBIEN) 10 MG tablet Take 10 mg by mouth at bedtime as needed for sleep.        Review of Systems   All systems reviewed and negative except as stated in HPI  Blood pressure (!) 162/92, pulse 98, temperature 98.5 F (36.9 C), temperature source Oral, resp. rate 20, height 5\' 3"  (1.6 m), weight 83.5 kg, last menstrual period 12/18/2019, SpO2 100 %. General appearance: alert,  cooperative and appears stated age, appears very uncomfortable with contractions Lungs: increased WOB with contractions Heart: tachycardic Abdomen: soft, non-tender Extremities: no sign of DVT Presentation: cephalic Fetal monitoringBaseline: 125 bpm, Variability: Good {> 6 bpm), Accelerations: Reactive and Decelerations: Absent Uterine activityFrequency: Every 2-3 minutes Dilation: 10 Effacement (%): 100 Station: Plus 2 Exam by:: Dr [email or web address]  Prenatal labs: ABO, Rh: --/--/O POS (12/29 2353) Antibody: NEG (12/29 2353) Rubella: 18.30 (07/08 1529) RPR: NON-REACTIVE (11/19 0000)  HBsAg: NON-REACTIVE (07/08 1529)  HIV: NON-REACTIVE (11/19 0000)  GBS:   unknown (swab collected on admission) 2 hr Glucola wnl Genetic screening wnl Anatomy 03-25-1981 wnl; f/u ultrasound with FGR (EFW 8% at [redacted]w[redacted]d)  Prenatal Transfer Tool  Maternal Diabetes: No Genetic Screening: Normal Maternal Ultrasounds/Referrals: Normal except for FGR (EFW 8% at [redacted]w[redacted]d) Fetal Ultrasounds or other Referrals:  Fetal echo wnl (family hx of bicuspid aortic valve) Maternal Substance Abuse:  No Significant Maternal Medications:  valtrex Significant Maternal Lab Results: Other: GBS unknown (preterm), h/o HSV (no active lesions on admission with good adherence to suppressive therapy)  Results for orders placed or performed during the hospital encounter of 08/12/20 (from the past 24 hour(s))  Protein / creatinine ratio, urine   Collection Time: 08/12/20 11:31 PM  Result Value Ref Range   Creatinine, Urine 197.60 mg/dL   Total Protein, Urine 69 mg/dL   Protein Creatinine Ratio 0.35 (H) 0.00 - 0.15 mg/mg[Cre]  Comprehensive metabolic panel   Collection Time: 08/12/20 11:41 PM  Result Value Ref Range   Sodium 136 135 - 145 mmol/L   Potassium 3.6 3.5 - 5.1 mmol/L   Chloride 104 98 - 111 mmol/L   CO2 17 (L) 22 - 32 mmol/L   Glucose, Bld 131 (H) 70 - 99 mg/dL   BUN 11 6 - 20 mg/dL   Creatinine, Ser 08/14/20 0.44 - 1.00 mg/dL    Calcium 9.4 8.9 - 6.64 mg/dL   Total Protein 7.2 6.5 - 8.1 g/dL   Albumin 3.5 3.5 - 5.0 g/dL   AST 27 15 - 41 U/L   ALT 13 0 - 44 U/L   Alkaline Phosphatase 95 38 - 126 U/L   Total Bilirubin 0.5 0.3 - 1.2 mg/dL   GFR, Estimated 40.3 >47 mL/min   Anion gap 15 5 - 15  CBC   Collection Time: 08/12/20 11:41 PM  Result Value Ref Range   WBC 14.5 (  H) 4.0 - 10.5 K/uL   RBC 4.21 3.87 - 5.11 MIL/uL   Hemoglobin 13.9 12.0 - 15.0 g/dL   HCT 92.1 19.4 - 17.4 %   MCV 92.9 80.0 - 100.0 fL   MCH 33.0 26.0 - 34.0 pg   MCHC 35.5 30.0 - 36.0 g/dL   RDW 08.1 44.8 - 18.5 %   Platelets 288 150 - 400 K/uL   nRBC 0.0 0.0 - 0.2 %  Type and screen [facility]   Collection Time: 08/12/20 11:53 PM  Result Value Ref Range   ABO/RH(D) O POS    Antibody Screen NEG    Sample Expiration      08/15/2020,2359 Performed at [facility], [address] [place], [place] [address]     Patient Active Problem List   Diagnosis Date Noted  . Pregnancy affected by fetal growth restriction 08/12/2020  . Preterm labor 08/12/2020  . Family history of congenital heart defect 04/30/2020  . AMA (advanced maternal age) primigravida 35+ 03/13/2020  . Supervision of normal pregnancy 02/20/2020  . Herpes   . HPV test positive 10/19/2014  . Endometriosis 03/15/2011  . Bacterial vaginosis 03/15/2011    Assessment/Plan:  Maria [name] is a 39 y.o. G2P0010 at [redacted]w[redacted]d here for active preterm labor in setting of elevated blood pressures.  #Preterm Labor: Pt s/p BMZ on 12/28 & 12/29. Ampicillin started on admission. Will manage expectantly given pt in active phase of labor. #Pain: Pt desires epidural. IV fentanyl administered in MAU. #FWB:  Category 1 strip #ID: GBS unknown; swab collected in MAU #MOF: breast #MOC: undecided #Circ: n/a #FGR: EFW 8% at [redacted]w[redacted]d as noted above. Plan for NICU at time of delivery given preterm with FGR. #H/o HSV: good adherence to valtrex. No visible lesions on GU exam.  No reported signs/symptoms. #Elevated Bps: pt asymptomatic with elevated blood pressures on arrival to MAU in active labor without epidural. With f/u preeclampsia labs and treat accordingly. #Family History of bicuspid aortic valve: normal fetal echo. Peds aware.  Sheila Oats, MD OB Fellow, Faculty Practice 08/13/2020 2:49 AM

## 2020-08-13 NOTE — Progress Notes (Signed)
Post Partum Day 1 Subjective: no complaints, up ad lib, voiding and tolerating PO  Objective: Blood pressure 126/76, pulse 75, temperature 98.2 F (36.8 C), temperature source Oral, resp. rate 16, height 5\' 3"  (1.6 m), weight 83.5 kg, last menstrual period 12/18/2019, SpO2 100 %.  Physical Exam:  General: alert, cooperative and no distress Lochia: appropriate Uterine Fundus: firm Incision:  DVT Evaluation: No evidence of DVT seen on physical exam.  Recent Labs    08/11/20 0757 08/12/20 2341  HGB 13.1 13.9  HCT 36.8 39.1    Assessment/Plan: Plan for discharge tomorrow  Begin Norvasc 5 mg   LOS: 1 day   08/14/20 08/13/2020, 7:21 AM

## 2020-08-13 NOTE — Discharge Instructions (Signed)

## 2020-08-14 MED ORDER — BENZOCAINE-MENTHOL 20-0.5 % EX AERO: 1.0000 "application " | INHALATION_SPRAY | CUTANEOUS | 1 refills | Status: DC | PRN

## 2020-08-14 MED ORDER — AMLODIPINE BESYLATE 5 MG PO TABS: 5.0000 mg | ORAL_TABLET | Freq: Every day | ORAL | 1 refills | Status: DC

## 2020-08-14 MED ORDER — IBUPROFEN 600 MG PO TABS: 600.0000 mg | ORAL_TABLET | Freq: Four times a day (QID) | ORAL | 0 refills | Status: DC

## 2020-08-14 NOTE — Lactation Note (Signed)
This note was copied from a baby's chart. Lactation Consultation Note  Patient Name: Maria [name] [id number] Date: 08/14/2020   Age:38 hours  Met with Maria [name] at baby Maria Maria [name]'s bedside.  LC attempted to see mom in her room but she was visiting baby. Mom reports she has been pumping every three hours.  Mom reports started getting drops with pumping.  Mom reports getting more with hand expression.  Discussed with mom how this was normal at first.  Praised efforts.  Urged her to keep doing what she has been doing.  Urged parents to watch Maria [name] on ARAMARK [name].   Mom reports she is being d/c today.  Mom reports her nipples are starting to get a little sore.  Discussed flange fit with mom.  Discussed lubrication for nipples.  Left coconut oil at moms bedside to use with pumping.  Discussed as milk comes to volume may need to pump more often.  Reminded mom she could pump 8-12 times day.  Did not discuss changing from initiate to maintenance setting at this visit. Baby Maria [place] 79 hours old.   Mom will be followed up with in NICU.                       Shadia Larose S Doaa Kendzierski 08/14/2020, 12:01 PM

## 2020-08-14 NOTE — Progress Notes (Signed)
Post Partum Day 2 Subjective: no complaints, up ad lib, voiding and tolerating PO  Objective: Blood pressure 138/85, pulse 69, temperature 98.2 F (36.8 C), temperature source Oral, resp. rate 18, height 5\' 3"  (1.6 m), weight 83.5 kg, last menstrual period 12/18/2019, SpO2 99 %.  Physical Exam:  General: alert, cooperative and no distress Lochia: appropriate Uterine Fundus: firm DVT Evaluation: No evidence of DVT seen on physical exam.  Recent Labs    08/12/20 2341  HGB 13.9  HCT 39.1    Assessment/Plan: Discharge home, Breastfeeding and Contraception considering OCPs  BP stable, Norvasc 5 mg, asx.    LOS: 2 days   08/14/20 08/14/2020, 8:19 AM

## 2020-08-15 LAB — CULTURE, BETA STREP (GROUP B ONLY)

## 2020-08-16 ENCOUNTER — Ambulatory Visit: Payer: Self-pay

## 2020-08-16 NOTE — Lactation Note (Signed)
This note was copied from a baby's chart. Lactation Consultation Note  Patient Name: Girl Tesslyn Baumert [id number] Date: 08/16/2020   Age:39 days   LC attempted to visit family but they were not in the room. Lactation will continue to check on mom and baby.    Maternal Data    Feeding Feeding Type: Donor Breast Milk  LATCH Score                   Interventions    Lactation Tools Discussed/Used     Consult Status      Maryruth Hancock [facility] 08/16/2020, 11:45 AM

## 2020-08-16 NOTE — Lactation Note (Deleted)
This note was copied from a baby's chart. Lactation Consultation Note  Patient Name: Girl Niema Carrara [id number] Date: 08/16/2020   Age:39 days   Mom not in room at time of lactation visit.    Maternal Data    Feeding Feeding Type: Donor Breast Milk  LATCH Score                   Interventions    Lactation Tools Discussed/Used     Consult Status      Maryruth Hancock [facility] 08/16/2020, 3:34 PM

## 2020-08-17 ENCOUNTER — Ambulatory Visit: Payer: BC Managed Care – PPO

## 2020-08-17 ENCOUNTER — Ambulatory Visit: Payer: Self-pay

## 2020-08-17 LAB — SURGICAL PATHOLOGY

## 2020-08-17 NOTE — Lactation Note (Signed)
This note was copied from a baby's chart. Lactation Consultation Note  Patient Name: Maria [name] [id number] Date: 08/17/2020 Reason for consult: Follow-up assessment;NICU baby;Late-preterm 34-36.6wks;Primapara;1st time breastfeeding Age:39 days   1402 - 1430 - I followed up with Ms. Cudney upon request for latch assistance. I assisted with placing baby [place] STS on the left breast while she received her feeding via gavage feeding. Baby did some excellent licking at the breast while Ms. Geigle hand expressed her breast milk.  After about 10 minutes, baby opened her mouth and latched with notable suckling and breast tissue movement. Her suckle was disorganized, but she did latch repeatedly with good jaw action, and Ms. Ferdinand reports feeling a tug.  I praised Ms. Macbride and baby and reviewed feeding expectation for the LPI. I recommended offering opportunities to lick and explore and latch for baby. I also recommended that she post pump and continue to pump 8+ times a day. She is getting small amounts of milk with her Spectra pump and is considering renting a Symphony. I reviewed the settings on the Symphony and recommended that she use the maintenance setting.  I recommended that lactation follow up within a week, and I encouraged Ms. Hartis to call and set up a lactation appointment as needed. She verbalized understanding and thanked me.   Maternal Data Has patient been taught Hand Expression?: Yes Does the patient have breastfeeding experience prior to this delivery?: No  Feeding Feeding Type: Breast Fed  LATCH Score Latch: Repeated attempts needed to sustain latch, nipple held in mouth throughout feeding, stimulation needed to elicit sucking reflex.  Audible Swallowing: None  Type of Nipple: Everted at rest and after stimulation  Comfort (Breast/Nipple): Soft / non-tender  Hold (Positioning): Assistance needed to correctly position infant at breast and maintain latch.  LATCH Score:  6  Interventions Interventions: Breast feeding basics reviewed;Assisted with latch;Skin to skin;Hand express;Breast compression;Support pillows;Adjust position  Lactation Tools Discussed/Used Tools: Pump Breast pump type: Double-Electric Breast Pump Pump Education: Setup, frequency, and cleaning   Consult Status Consult Status: Follow-up Date: 08/21/20 Follow-up type: In-patient    Walker Shadow 08/17/2020, 3:34 PM

## 2020-08-18 ENCOUNTER — Ambulatory Visit: Payer: Self-pay

## 2020-08-18 ENCOUNTER — Ambulatory Visit: Payer: BC Managed Care – PPO

## 2020-08-18 NOTE — Lactation Note (Signed)
This note was copied from a baby's chart. Lactation Consultation Note  Patient Name: Girl Campbell Agramonte [id number]'I Date: 08/18/2020 Reason for consult: Follow-up assessment Age:39 days   LC Follow Up Visit:  Mother holding her daughter, Karel Jarvis, while her gavage feeding infuses.  Mother reported nuzzling and "lick and learn" at the 1400 feeding.  She has been able to express drops for [facility] during nuzzling.   Mother continues to pump every three hours with a volume ranging from a few mls to approximately 30 mls with the last two pumping sessions combined.  Encouraged continued hand expression before/after feedings to aid in milk supply.  Discussed using a "hands free" bra so mother can massage and do breast compressions during pumping.  Explained that I could make her a "hands free" bra with a binder.  Mother informed me that she has ordered a nursing bra and that it should be delivered today.  If she does not have it tomorrow when she visits, she may ask for a binder.    Mother has rented a Symphony pump from the gift shop for one month.  By the time she returns it, she anticipates going to her own Spectra pump and being able to maintain a full milk supply.  Praised her efforts and mother is pleased with her progress.  Mother is aware of realistic expectations for her daughter.  Father will be visiting after work today.  Mother will call for any questions/concerns.   Maternal Data    Feeding Feeding Type: Donor Breast Milk  LATCH Score                   Interventions    Lactation Tools Discussed/Used     Consult Status Consult Status: Follow-up Date: 08/19/20 Follow-up type: In-patient    Dora Sims 08/18/2020, 5:22 PM

## 2020-08-21 ENCOUNTER — Other Ambulatory Visit: Payer: Self-pay

## 2020-08-21 ENCOUNTER — Encounter: Payer: Self-pay | Admitting: Women's Health

## 2020-08-21 ENCOUNTER — Ambulatory Visit (INDEPENDENT_AMBULATORY_CARE_PROVIDER_SITE_OTHER): Payer: BC Managed Care – PPO | Admitting: Women's Health

## 2020-08-21 VITALS — BP 134/90 | HR 78 | Ht 65.0 in | Wt 171.0 lb

## 2020-08-21 DIAGNOSIS — Z0130 Encounter for examination of blood pressure without abnormal findings: Secondary | ICD-10-CM | POA: Diagnosis not present

## 2020-08-21 MED ORDER — AMLODIPINE BESYLATE 2.5 MG PO TABS: 2.5000 mg | ORAL_TABLET | Freq: Every day | ORAL | 1 refills | Status: AC

## 2020-08-21 NOTE — Patient Instructions (Signed)
Maternity Assessment Unit (MAU)  The Maternity Assessment Unit (MAU) is located at the [facility] at [facility]. The address is: [address] [place], [place], [place] [address]. Please see map below for additional directions.    The Maternity Assessment Unit is designed to help you during your pregnancy, and for up to 6 weeks after delivery, with any pregnancy- or postpartum-related emergencies, if you think you are in labor, or if your water has broken. For example, if you experience nausea and vomiting, vaginal bleeding, severe abdominal or pelvic pain, elevated blood pressure or other problems related to your pregnancy or postpartum time, please come to the Maternity Assessment Unit for assistance.        Preeclampsia and Eclampsia Preeclampsia is a serious condition that may develop during pregnancy. This condition causes high blood pressure and increased protein in your urine along with other symptoms, such as headaches and vision changes. These symptoms may develop as the condition gets worse. Preeclampsia may occur at 20 weeks of pregnancy or later. Diagnosing and treating preeclampsia early is very important. If not treated early, it can cause serious problems for you and your baby. One problem it can lead to is eclampsia. Eclampsia is a condition that causes muscle jerking or shaking (convulsions or seizures) and other serious problems for the mother. During pregnancy, delivering your baby may be the best treatment for preeclampsia or eclampsia. For most women, preeclampsia and eclampsia symptoms go away after giving birth. In rare cases, a woman may develop preeclampsia after giving birth (postpartum preeclampsia). This usually occurs within 48 hours after childbirth but may occur up to 6 weeks after giving birth. What are the causes? The cause of preeclampsia is not known. What increases the risk? The following risk factors make you more likely  to develop preeclampsia:  Being pregnant for the first time.  Having had preeclampsia during a past pregnancy.  Having a family history of preeclampsia.  Having high blood pressure.  Being pregnant with more than one baby.  Being 18 or older.  Being African-American.  Having kidney disease or diabetes.  Having medical conditions such as lupus or blood diseases.  Being very overweight (obese). What are the signs or symptoms? The most common symptoms are:  Severe headaches.  Vision problems, such as blurred or double vision.  Abdominal pain, especially upper abdominal pain. Other symptoms that may develop as the condition gets worse include:  Sudden weight gain.  Sudden swelling of the hands, face, legs, and feet.  Severe nausea and vomiting.  Numbness in the face, arms, legs, and feet.  Dizziness.  Urinating less than usual.  Slurred speech.  Convulsions or seizures. How is this diagnosed? There are no screening tests for preeclampsia. Your health care provider will ask you about symptoms and check for signs of preeclampsia during your prenatal visits. You may also have tests that include:  Checking your blood pressure.  Urine tests to check for protein. Your health care provider will check for this at every prenatal visit.  Blood tests.  Monitoring your baby's heart rate.  Ultrasound. How is this treated? You and your health care provider will determine the treatment approach that is best for you. Treatment may include:  Having more frequent prenatal exams to check for signs of preeclampsia, if you have an increased risk for preeclampsia.  Medicine to lower your blood pressure.  Staying in the hospital, if your condition is severe. There, treatment will focus on controlling  your blood pressure and the amount of fluids in your body (fluid retention).  Taking medicine (magnesium sulfate) to prevent seizures. This may be given as an injection or through  an IV.  Taking a low-dose aspirin during your pregnancy.  Delivering your baby early. You may have your labor started with medicine (induced), or you may have a cesarean delivery. Follow these instructions at home: Eating and drinking   Drink enough fluid to keep your urine pale yellow.  Avoid caffeine. Lifestyle  Do not use any products that contain nicotine or tobacco, such as cigarettes and e-cigarettes. If you need help quitting, ask your health care provider.  Do not use alcohol or drugs.  Avoid stress as much as possible. Rest and get plenty of sleep. General instructions  Take over-the-counter and prescription medicines only as told by your health care provider.  When lying down, lie on your left side. This keeps pressure off your major blood vessels.  When sitting or lying down, raise (elevate) your feet. Try putting some pillows underneath your lower legs.  Exercise regularly. Ask your health care provider what kinds of exercise are best for you.  Keep all follow-up and prenatal visits as told by your health care provider. This is important. How is this prevented? There is no known way of preventing preeclampsia or eclampsia from developing. However, to lower your risk of complications and detect problems early:  Get regular prenatal care. Your health care provider may be able to diagnose and treat the condition early.  Maintain a healthy weight. Ask your health care provider for help managing weight gain during pregnancy.  Work with your health care provider to manage any long-term (chronic) health conditions you have, such as diabetes or kidney problems.  You may have tests of your blood pressure and kidney function after giving birth.  Your health care provider may have you take low-dose aspirin during your next pregnancy. Contact a health care provider if:  You have symptoms that your health care provider told you may require more treatment or monitoring, such  as: ? Headaches. ? Nausea or vomiting. ? Abdominal pain. ? Dizziness. ? Light-headedness. Get help right away if:  You have severe: ? Abdominal pain. ? Headaches that do not get better. ? Dizziness. ? Vision problems. ? Confusion. ? Nausea or vomiting.  You have any of the following: ? A seizure. ? Sudden, rapid weight gain. ? Sudden swelling in your hands, ankles, or face. ? Trouble moving any part of your body. ? Numbness in any part of your body. ? Trouble speaking. ? Abnormal bleeding.  You faint. Summary  Preeclampsia is a serious condition that may develop during pregnancy.  This condition causes high blood pressure and increased protein in your urine along with other symptoms, such as headaches and vision changes.  Diagnosing and treating preeclampsia early is very important. If not treated early, it can cause serious problems for you and your baby.  Get help right away if you have symptoms that your health care provider told you to watch for. This information is not intended to replace advice given to you by your health care provider. Make sure you discuss any questions you have with your health care provider. Document Revised: 04/03/2018 Document Reviewed: 03/07/2016 Elsevier Patient Education  2020 [employer].

## 2020-08-21 NOTE — Progress Notes (Signed)
Negative depression screen Pt has no complaints

## 2020-08-21 NOTE — Progress Notes (Signed)
History:  Maria [name] is a 40 y.o. G2P0011 who presents to clinic today for BP check and mood check. Patient reports feeling well, reports she has been taking her BP medication daily without missing doses and last took her BP medication about prior to arrival for her appointment. Patient reports good mood and denies HA, SOB/chest pain, swelling, N/V, abdominal pain or other s/sx.   The following portions of the patient's history were reviewed and updated as appropriate: allergies, current medications, family history, past medical history, social history, past surgical history and problem list.  Review of Systems:  Review of Systems  Constitutional: Negative for chills and fever.  Respiratory: Negative for shortness of breath.   Cardiovascular: Negative for chest pain.  Gastrointestinal: Negative for abdominal pain, nausea and vomiting.  Neurological: Negative for headaches.     Objective:  Physical Exam BP 134/90   Pulse 78   Ht 5\' 5"  (1.651 m)   Wt 171 lb (77.6 kg)   LMP 12/18/2019 (LMP Unknown)   Breastfeeding Yes   BMI 28.46 kg/m    Physical Exam Vitals reviewed.  Constitutional:      General: She is not in acute distress.    Appearance: Normal appearance. She is not ill-appearing, toxic-appearing or diaphoretic.  HENT:     Head: Normocephalic and atraumatic.  Pulmonary:     Effort: Pulmonary effort is normal.  Neurological:     Mental Status: She is alert and oriented to person, place, and time.  Psychiatric:        Mood and Affect: Mood normal.        Behavior: Behavior normal.        Thought Content: Thought content normal.        Judgment: Judgment normal.      Edinburgh Postnatal Depression Scale - 08/21/20 1019      Edinburgh Postnatal Depression Scale:  In the Past 7 Days   I have been able to laugh and see the funny side of things. 0    I have looked forward with enjoyment to things. 0    I have blamed myself unnecessarily when things went  wrong. 0    I have been anxious or worried for no good reason. 0    I have felt scared or panicky for no good reason. 0    Things have been getting on top of me. 0    I have been so unhappy that I have had difficulty sleeping. 0    I have felt sad or miserable. 0    I have been so unhappy that I have been crying. 0    The thought of harming myself has occurred to me. 0    Edinburgh Postnatal Depression Scale Total 0           Labs and Imaging No results found for this or any previous visit (from the past 24 hour(s)).  No results found.   Assessment & Plan:   1. BP check -BP 134/90, same as upon discharge from hospital - increase amlodipine by 2.5mg , new RX sent, pt to increase dose to 7.5mg  daily, recheck BP in one week -Edinburgh score: 0 --Reviewed warning blood pressure values (systolic = / > 140 and/or diastolic =/> 90). Explained that, if blood pressure is elevated, she should sit down, rest, and eat/drink something. If still elevated 15 minutes later, she should call clinic or come to MAU. She should come to MAU if she has elevated pressures and  any of the following:  - headache not relieved with tylenol, rest, hydration -blurry vision, floating spots in her vision - sudden full-body edema or facial edema -RUQ pain that is constant.  These symptoms may indicate that her blood pressure is worsening and she may be developing gestational hypertension or pre-eclampsia, which is an emergency.     Approximately 5 minutes of total time was spent with this patient on counseling and coordination of care  Perline Awe, Odie Sera, NP 08/21/2020 11:27 AM

## 2020-08-25 ENCOUNTER — Ambulatory Visit: Payer: BC Managed Care – PPO

## 2020-08-26 ENCOUNTER — Ambulatory Visit: Payer: Self-pay

## 2020-08-26 NOTE — Lactation Note (Signed)
This note was copied from a baby's chart. Lactation Consultation Note  Patient Name: Maria [name] AVWPV'X Date: 08/26/2020 Reason for consult: Follow-up assessment;Primapara;1st time breastfeeding;Infant < 6lbs;Late-preterm 34-36.6wks Age:39 days  LC in to assist with positioning and latching [place] to the breast.  Karel Jarvis is 4 lbs 15.5 oz  Baby is being gavage fed and bottle fed and Mom offering breast at each feeding,when she is here.   Mom has been consistently pumping every 3 hrs and expresses 5-20 ml each pumping. Mom had + breast changes with pregnancy.  Assisted Mom with positioning baby in cross cradle hold, baby elevated on pillows at breast height.  Mom assisted to support and sandwich her breast.  Mom has small, erect nipples.   Baby rooting and opening her mouth wide.  Assisted Mom to bring baby to breast quickly when she opens her mouth wide.  Mom is anxious regarding baby's nose being occluded.  Reassured her to hug baby's body in closely, and leading with baby's jaw into breast.  Baby unable to sustain a deep latch after a couple sucks.  Tried in football hold on right breast.  Mom easily expresses milk onto nipple.  After a couple attempts, initiated a 16 mm nipple shield, showing Mom how to apply properly to pull nipple into shield.  Baby latched and sucked a few times, unable to identify any swallows.  When baby was latch, but not sucking, she had a bradycardic episode to 75 for about 30 seconds.  No change in color noted.  Baby taken off breast and she recovered without any stimulation.    Recommended to Mom that she pump with baby STS between her breasts to stimulate her milk supply.  Gave Mom a belly band to provide for hand's free pumping.  Baby placed STS.  Mom pumped for 25 mins and expressed 17 ml.  Mom educated about "power-pumping" and allowing herself to sleep for 4-6 hr stretches along with cluster pumping to simulate baby breastfeeding.   Mom desires LC assistance  tomorrow at 11 am.    Feeding Feeding Type: Breast Fed  LATCH Score Latch: Repeated attempts needed to sustain latch, nipple held in mouth throughout feeding, stimulation needed to elicit sucking reflex.  Audible Swallowing: None  Type of Nipple: Everted at rest and after stimulation  Comfort (Breast/Nipple): Soft / non-tender  Hold (Positioning): Assistance needed to correctly position infant at breast and maintain latch.  LATCH Score: 6  Interventions Interventions: Breast feeding basics reviewed;Assisted with latch;Skin to skin;Breast massage;Hand express;Breast compression;Adjust position;Support pillows;Position options;Expressed milk;DEBP  Lactation Tools Discussed/Used Tools: Pump;Flanges;Nipple Shields Nipple shield size: 16 Flange Size: 24 Breast pump type: Double-Electric Breast Pump   Consult Status Consult Status: Follow-up Date: 08/27/20 Follow-up type: In-patient    Judee Clara 08/26/2020, 2:44 PM

## 2020-08-27 ENCOUNTER — Ambulatory Visit: Payer: Self-pay

## 2020-08-27 ENCOUNTER — Encounter: Payer: Self-pay | Admitting: *Deleted

## 2020-08-27 ENCOUNTER — Ambulatory Visit (INDEPENDENT_AMBULATORY_CARE_PROVIDER_SITE_OTHER): Payer: BC Managed Care – PPO | Admitting: *Deleted

## 2020-08-27 ENCOUNTER — Other Ambulatory Visit: Payer: Self-pay

## 2020-08-27 VITALS — BP 128/86 | HR 81 | Resp 16 | Ht 65.0 in

## 2020-08-27 DIAGNOSIS — Z0130 Encounter for examination of blood pressure without abnormal findings: Secondary | ICD-10-CM

## 2020-08-27 NOTE — Progress Notes (Signed)
Pt here for BP check only.  She is on 7.5 mg of Norvasc.  Her BP today is 128/86.  She denies any headaches at this time.  Her baby is still in the NICU.  She will continue her meds and return in 2 weeks.  She is aware that if she has a H/A that just will not go away no matter what she does then she is to call the office..  She is to return in 2 weeks

## 2020-08-27 NOTE — Lactation Note (Signed)
This note was copied from a baby's chart. Lactation Consultation Note  Patient Name: Maria [name] GHWEX'H Date: 08/27/2020 Reason for consult: Follow-up assessment;Late-preterm 34-36.6wks;Primapara;1st time breastfeeding;Infant < 6lbs;NICU baby Age:39 wk.o.   P1 mother whose infant is now 81 weeks old.  This is a LPTI born at 34+1 weeks with a CGA of 36+1 weeks and in the NICU.  Mother requested latch assistance.  When I arrived [name] was awake in her bassinet.  Mother is interested in trying the football hold today with my help.  Positioned with good pillow support.  Mother was given a NS but prefers not to use it if possible.  Her breasts are soft and non tender and nipples are small, short and everted.  Assisted to latch in the football hold to the left breast fairly easily.  Baby had a wide gape and flanged lips at the breast.  Mother felt a good tug at the breast.  Demonstrated breast compressions and gentle stimulation.  After a few sucks she became resltess.  Burped her and latched again.  Baby continued to suck on/off with good jaw extension; no swallows noted.  Observed her feeding for 17 minutes.  Baby became sleepy and pulled off the breast.  Placed her STS on mother's chest where she remained awake and calm.  Mother has been able to pump approximately 30 mls/session both breasts combined.  Encouraged continued breast massage and hand expression.  She pumped last night at 1800, 2000 and 2200 to see if this would help increase her supply.  She felt maybe this helped slightly with her volume.  Discussed power pumping and mother interested in trying this tonight also; recommended 2x/day.  Mother familiar with power pumping.    Praised mother for her efforts.  She will continue to use lactation services when she is here.  RN in room at the end of the feeding and will not be gavage feeding any EBM at this time.     Maternal Data Formula Feeding for Exclusion: No Has patient been taught  Hand Expression?: Yes Does the patient have breastfeeding experience prior to this delivery?: No  Feeding Feeding Type: Breast Fed  LATCH Score Latch: Repeated attempts needed to sustain latch, nipple held in mouth throughout feeding, stimulation needed to elicit sucking reflex.  Audible Swallowing: None  Type of Nipple: Everted at rest and after stimulation  Comfort (Breast/Nipple): Soft / non-tender  Hold (Positioning): Assistance needed to correctly position infant at breast and maintain latch.  LATCH Score: 6  Interventions Interventions: Assisted with latch;Breast feeding basics reviewed;Skin to skin;Breast massage;Hand express;Adjust position;Breast compression;DEBP;Position options;Support pillows  Lactation Tools Discussed/Used Breast pump type: Double-Electric Breast Pump;Manual   Consult Status Consult Status: Follow-up Date: 08/28/20 Follow-up type: In-patient    Saundra Gin R Garek Schuneman 08/27/2020, 11:39 AM

## 2020-08-28 ENCOUNTER — Encounter: Payer: Self-pay | Admitting: *Deleted

## 2020-09-01 ENCOUNTER — Ambulatory Visit: Payer: BC Managed Care – PPO

## 2020-09-06 ENCOUNTER — Ambulatory Visit: Payer: Self-pay

## 2020-09-06 NOTE — Lactation Note (Signed)
This note was copied from a baby's chart. Lactation Consultation Note  Patient Name: Maria [name] KGOVP'C Date: 09/06/2020 Reason for consult: Follow-up assessment;NICU baby;Other (Comment) (discharge) Age:39 wk.o.  LC to room for f/u visit. Baby to d/c today. Mom continues to bf/ pump and also bottle feeds formula. LC answered questions regarding fortification of human milk. Mom is aware of community resources. Parents were provided with the opportunity to ask questions. All concerns were addressed.  LC services are complete.    Elder Negus, MA IBCLC 09/06/2020, 4:10 PM

## 2020-09-10 ENCOUNTER — Ambulatory Visit (INDEPENDENT_AMBULATORY_CARE_PROVIDER_SITE_OTHER): Payer: BC Managed Care – PPO

## 2020-09-10 ENCOUNTER — Other Ambulatory Visit: Payer: Self-pay

## 2020-09-10 VITALS — BP 124/75 | HR 87

## 2020-09-10 DIAGNOSIS — Z0130 Encounter for examination of blood pressure without abnormal findings: Secondary | ICD-10-CM

## 2020-09-10 NOTE — Progress Notes (Signed)
Pt here for BP check. Pt has no complaints. BP 124/75. Pt will continue to take BP at home and will call office if it becomes elevated or if she develops a headache or visual changes. Pt will come to postpartum visit on 09/25/20.

## 2020-09-18 ENCOUNTER — Other Ambulatory Visit: Payer: Self-pay

## 2020-09-18 ENCOUNTER — Ambulatory Visit (INDEPENDENT_AMBULATORY_CARE_PROVIDER_SITE_OTHER): Payer: BC Managed Care – PPO

## 2020-09-18 DIAGNOSIS — F419 Anxiety disorder, unspecified: Secondary | ICD-10-CM

## 2020-09-18 NOTE — Progress Notes (Signed)
[place] Postnatal Depression Scale: Score 5  Pt sent a message through MyChart asking if she can restart her Prozac. Message was forwarded to Donette Larry, CNM who requested I speak to pt and do the depression scale. I spoke with pt over the telephone and she scored a 5 on postnatal depression scale. Pt states her anxiety is very situational right now due to the changes of having a newborn at home. Pt states she goes through waves of "not loving this" stage of life. Pt denies any suicidal thoughts. Pt is aware that Shawna Orleans will reach out to her in regards to starting her medication.

## 2020-09-19 ENCOUNTER — Other Ambulatory Visit: Payer: Self-pay | Admitting: Certified Nurse Midwife

## 2020-09-19 DIAGNOSIS — F419 Anxiety disorder, unspecified: Secondary | ICD-10-CM

## 2020-09-19 MED ORDER — FLUOXETINE HCL 10 MG PO CAPS: 10.0000 mg | ORAL_CAPSULE | Freq: Every day | ORAL | 1 refills | Status: DC

## 2020-09-19 NOTE — Progress Notes (Signed)
Postpartum anxiety, restart Prozac 10mg , f/u on in office on 09/25/20

## 2020-09-25 ENCOUNTER — Other Ambulatory Visit: Payer: Self-pay

## 2020-09-25 ENCOUNTER — Ambulatory Visit (INDEPENDENT_AMBULATORY_CARE_PROVIDER_SITE_OTHER): Payer: BC Managed Care – PPO | Admitting: Certified Nurse Midwife

## 2020-09-25 ENCOUNTER — Encounter: Payer: Self-pay | Admitting: Certified Nurse Midwife

## 2020-09-25 DIAGNOSIS — F419 Anxiety disorder, unspecified: Secondary | ICD-10-CM

## 2020-09-25 DIAGNOSIS — O1494 Unspecified pre-eclampsia, complicating childbirth: Secondary | ICD-10-CM

## 2020-09-25 NOTE — Progress Notes (Signed)
Post Partum Visit Note  Maria [name] is a 40 y.o. G41P0011 female who presents for a postpartum visit. She is 6 weeks postpartum following a normal spontaneous vaginal delivery.  I have fully reviewed the prenatal and intrapartum course. The delivery was at 34.1gestational weeks.  Anesthesia: none. Postpartum course has been remarkable with episodes of depression. Baby is doing well after d/c from NICU. Baby is feeding by bottle - Similac Neosure. Bleeding red. Bowel function is normal. Bladder function is normal. Patient is not sexually active. Contraception method is undecided. Postpartum depression screening: positive. She re-started Prozac 1 week ago. Reports feeling better mentally. Feeling very sleep deprived as she is primary caregiver for the baby who is eating q3 hrs and husband works all week.    The pregnancy intention screening data noted above was reviewed. Potential methods of contraception were discussed. The patient elected to proceed with Female Condom.    Edinburgh Postnatal Depression Scale - 09/25/20 0932      Edinburgh Postnatal Depression Scale:  In the Past 7 Days   I have been able to laugh and see the funny side of things. 1    I have looked forward with enjoyment to things. 0    I have blamed myself unnecessarily when things went wrong. 0    I have been anxious or worried for no good reason. 2    I have felt scared or panicky for no good reason. 2    Things have been getting on top of me. 2    I have been so unhappy that I have had difficulty sleeping. 0    I have felt sad or miserable. 1    I have been so unhappy that I have been crying. 1    The thought of harming myself has occurred to me. 0    Edinburgh Postnatal Depression Scale Total 9            The following portions of the patient's history were reviewed and updated as appropriate: allergies, current medications, past family history, past medical history, past social history, past surgical history  and problem list.  Review of Systems Pertinent items are noted in HPI.    Objective:  BP 133/86   Pulse 77   Resp 16   Ht 5\' 3"  (1.6 m)   Wt 163 lb (73.9 kg)   LMP 09/23/2020   Breastfeeding No   BMI 28.87 kg/m    General:  alert, cooperative and no distress   Breasts:  not examined  Lungs: reg rate and effort  Heart:  normal rate  Abdomen: Not examined   Vulva: Not examined    Vagina: Not examined    Cervix:  Not examined    Corpus: Not examined    Adnexa:  Not examined  Rectal Exam: Not examined       Neuro: grossly intact Assessment:    Normal postpartum exam.  Plan:   Essential components of care per ACOG recommendations:  1.  Mood and well being: Patient with positive depression screening today. Reviewed local resources for support.  - restarted Prozac 1 week ago - recommend counseling- options provided - Patient does not use tobacco. If using tobacco we discussed reduction and for recently cessation risk of relapse - hx of drug use? No   If yes, discussed support systems  2. Infant care and feeding:  -Patient currently breastmilk feeding? No If breastmilk feeding discussed return to work and pumping. If needed, patient  was provided letter for work to allow for every 2-3 hr pumping breaks, and to be granted a private location to express breastmilk and refrigerated area to store breastmilk. Reviewed importance of draining breast regularly to support lactation. -Social determinants of health (SDOH) reviewed in EPIC. No concerns The following needs were identified none  3. Sexuality, contraception and birth spacing - Patient does not want a pregnancy in the next year.  Desired family size is 1 children.  - Reviewed forms of contraception in tiered fashion. Patient desired condoms, considering Paragard today.   - Discussed birth spacing of 18 months  4. Sleep and fatigue -Encouraged family/partner/community support of 4 hrs of uninterrupted sleep to help with  mood and fatigue - she was taking Ambien regularly but has stopped d/t having to be awake for the baby - husband can help more on weekends so she can get more sleep - may try Benadryl prn sleep, start with low dose  5. Physical Recovery  - Discussed patients delivery and complications - Patient had a sulcal laceration, perineal healing reviewed. Patient expressed understanding - Patient has urinary incontinence? No Patient was referred to pelvic floor PT  - Patient is safe to resume physical and sexual activity  6.  Health Maintenance - Last pap smear done 01/18/2018 and was normal with negative HPV.  7. Chronic Disease - PCP follow up  8. PEC - BP stable on Norvasc 7.5 mg - recommend continue current dose - recheck at nv  Follow up in 3 weeks for mood check and BP check   Donette Larry, CNM [facility] [employer], [facility]

## 2020-10-01 ENCOUNTER — Encounter: Payer: Self-pay | Admitting: *Deleted

## 2020-10-09 ENCOUNTER — Telehealth (INDEPENDENT_AMBULATORY_CARE_PROVIDER_SITE_OTHER): Payer: BC Managed Care – PPO | Admitting: Certified Nurse Midwife

## 2020-10-09 ENCOUNTER — Encounter: Payer: Self-pay | Admitting: Certified Nurse Midwife

## 2020-10-09 DIAGNOSIS — Z8279 Family history of other congenital malformations, deformations and chromosomal abnormalities: Secondary | ICD-10-CM | POA: Diagnosis not present

## 2020-10-09 DIAGNOSIS — F419 Anxiety disorder, unspecified: Secondary | ICD-10-CM | POA: Diagnosis not present

## 2020-10-09 DIAGNOSIS — O1494 Unspecified pre-eclampsia, complicating childbirth: Secondary | ICD-10-CM | POA: Diagnosis not present

## 2020-10-09 NOTE — Progress Notes (Signed)
Edinburgh Score 2

## 2020-10-09 NOTE — Progress Notes (Signed)
    GYNECOLOGY VIRTUAL VISIT ENCOUNTER NOTE  Provider location: Center for [facility] at [place]   I connected with Maria [name] on 10/09/37 at  9:30 AM EST by MyChart Video Encounter at home and verified that I am speaking with the correct person using two identifiers.   I discussed the limitations, risks, security and privacy concerns of performing an evaluation and management service virtually and the availability of in person appointments. I also discussed with the patient that there may be a patient responsible charge related to this service. The patient expressed understanding and agreed to proceed.   History:  Maria [name] is a 40 y.o. G60P0011 female being evaluated today for mood and BP. She is 39 weeks postpartum and being followed for anxiety and PEC. She reports getting more blocks of sleep which is helping her mood, childrens Benadryl is helping. Feels better on the Prozac, started 3 weeks ago. Reports BP is 130s/80s on Norvasc 7.5 mg daily, tolerating well.  Past Medical History:  Diagnosis Date  . Depression   . Endometriosis 1/12   Laps  . Herpes    Takes Valtrex daily; no history of outbreak  . History of chlamydia   . Infertility, female   . Reflux    Past Surgical History:  Procedure Laterality Date  . CHOLECYSTECTOMY  2007  . LAPAROSCOPY  1/12   Rt ov cyst and endometriosis   The following portions of the patient's history were reviewed and updated as appropriate: allergies, current medications, past family history, past medical history, past social history, past surgical history and problem list.   Health Maintenance:  Normal pap and negative HRHPV in 2019.   Review of Systems:  Pertinent items noted in HPI and remainder of comprehensive ROS otherwise negative.  Physical Exam:   General:  Alert, oriented and cooperative. Patient appears to be in no acute distress.  Mental Status: Normal mood and affect. Normal behavior. Normal judgment and  thought content.   Respiratory: Normal respiratory effort, no problems with respiration noted  Rest of physical exam deferred due to type of encounter  Labs and Imaging No results found for this or any previous visit (from the past 336 hour(s)). No results found.     Assessment and Plan:   1. Hypertension in pregnancy, preeclampsia, delivered   2. Anxiety    -continue Benadryl prn sleep -continue Prozac 10 mg -decrease Norvasc to 5mg  daily; check BP in 2-3 days, monitor for HTN   Follow up in 3-4 weeks for mood check and BP check- consider taper of Norvasc  I discussed the assessment and treatment plan with the patient. The patient was provided an opportunity to ask questions and all were answered. The patient agreed with the plan and demonstrated an understanding of the instructions.   The patient was advised to call back or seek an in-person evaluation/go to the ED if the symptoms worsen or if the condition fails to improve as anticipated.  I provided 12 minutes of face-to-face time during this encounter.   , CNM Center for [facility], [facility]

## 2020-10-17 ENCOUNTER — Other Ambulatory Visit: Payer: Self-pay | Admitting: Obstetrics & Gynecology

## 2020-10-29 DIAGNOSIS — O169 Unspecified maternal hypertension, unspecified trimester: Secondary | ICD-10-CM

## 2020-10-29 MED ORDER — AMLODIPINE BESYLATE 5 MG PO TABS: 5.0000 mg | ORAL_TABLET | Freq: Every day | ORAL | 0 refills | Status: DC

## 2020-10-29 NOTE — Telephone Encounter (Signed)
Refill of Norvasc sent. Pt comes on 3/23 for follow up appt.

## 2020-11-04 ENCOUNTER — Encounter: Payer: Self-pay | Admitting: Obstetrics and Gynecology

## 2020-11-04 ENCOUNTER — Other Ambulatory Visit: Payer: Self-pay

## 2020-11-04 ENCOUNTER — Telehealth (INDEPENDENT_AMBULATORY_CARE_PROVIDER_SITE_OTHER): Payer: BC Managed Care – PPO | Admitting: Obstetrics and Gynecology

## 2020-11-04 DIAGNOSIS — R4586 Emotional lability: Secondary | ICD-10-CM | POA: Diagnosis not present

## 2020-11-04 DIAGNOSIS — Z0130 Encounter for examination of blood pressure without abnormal findings: Secondary | ICD-10-CM | POA: Insufficient documentation

## 2020-11-04 NOTE — Progress Notes (Signed)
PHQ-9 Score -0 GAD-0 BP @ PCP on Monday 108/68 BP @ home today 137/95 but has not taken Norvasc today

## 2020-11-04 NOTE — Progress Notes (Signed)
    TELEHEALTH OBSTETRICS VISIT ENCOUNTER NOTE  Provider location: Center for [facility] at [place]   Patient location: Home  I connected with Maria [name] on 11/04/20 at  9:10 AM EDT by video at home and verified that I am speaking with the correct person using two identifiers.    I discussed the limitations, risks, security and privacy concerns of performing an evaluation and management service by telephone and the availability of in person appointments. I also discussed with the patient that there may be a patient responsible charge related to this service. The patient expressed understanding and agreed to proceed.  Subjective:  Maria [name] is a 40 y.o. G2P0011 status post vaginal delivery on 1/2. She had post partum depression and elevated BP PP. She was started on prozac and norvasc. She reports feeling much better, reports things are better now. She has continued to take her norvasc 10 mg and has had no issues.    Patient reports no complaints. Reports fetal movement. Denies any contractions, bleeding or leaking of fluid.   The following portions of the patient's history were reviewed and updated as appropriate: allergies, current medications, past family history, past medical history, past social history, past surgical history and problem list.   Objective:  There were no vitals taken for this visit. General:  Alert, oriented and cooperative.   Mental Status: Normal mood and affect perceived. Normal judgment and thought content.  Rest of physical exam deferred due to type of encounter  Assessment and Plan:  G2P0011 postpartum   1. Mood changes  Continue prozac daily. No further mood checks needed with our office. If needed she should see therapist/ psych.   2. BP check  Ok to ween off BP medications. If BP remains elevated she will need to see PCP.   I provided 7 minutes of non-face-to-face time during this encounter.  No follow-ups on file.  No  future appointments.  Venia Carbon, NP Center for [employer], [facility]

## 2021-02-14 ENCOUNTER — Other Ambulatory Visit: Payer: Self-pay

## 2022-03-04 ENCOUNTER — Other Ambulatory Visit: Payer: Self-pay

## 2022-05-05 ENCOUNTER — Other Ambulatory Visit: Payer: Self-pay

## 2022-05-12 ENCOUNTER — Other Ambulatory Visit: Payer: Self-pay

## 2022-05-12 DIAGNOSIS — B009 Herpesviral infection, unspecified: Secondary | ICD-10-CM

## 2022-05-12 MED ORDER — VALACYCLOVIR HCL 1 G PO TABS: 1000.0000 mg | ORAL_TABLET | Freq: Every day | ORAL | 1 refills | Status: AC

## 2022-05-12 NOTE — Progress Notes (Signed)
Pt needed refill of Valtrex

## 2022-08-23 ENCOUNTER — Ambulatory Visit: Payer: BC Managed Care – PPO | Admitting: Obstetrics and Gynecology

## 2022-08-30 ENCOUNTER — Encounter: Payer: Self-pay | Admitting: Obstetrics and Gynecology

## 2022-08-30 ENCOUNTER — Ambulatory Visit (INDEPENDENT_AMBULATORY_CARE_PROVIDER_SITE_OTHER): Payer: BC Managed Care – PPO | Admitting: Obstetrics and Gynecology

## 2022-08-30 ENCOUNTER — Other Ambulatory Visit (HOSPITAL_COMMUNITY)
Admission: RE | Admit: 2022-08-30 | Discharge: 2022-08-30 | Disposition: A | Discharge status: Home / Self Care | Payer: BC Managed Care – PPO | Source: Ambulatory Visit | Attending: Obstetrics and Gynecology | Admitting: Obstetrics and Gynecology

## 2022-08-30 VITALS — BP 131/86 | HR 80 | Resp 16 | Ht 63.0 in | Wt 150.0 lb

## 2022-08-30 DIAGNOSIS — Z01419 Encounter for gynecological examination (general) (routine) without abnormal findings: Secondary | ICD-10-CM

## 2022-08-30 NOTE — Progress Notes (Signed)
GYNECOLOGY ANNUAL PREVENTATIVE CARE ENCOUNTER NOTE  History:     Maria [name] is a 42 y.o. G56P0011 female here for a routine annual gynecologic exam.  Current complaints: None.   Denies abnormal vaginal bleeding, discharge, pelvic pain, problems with intercourse or other gynecologic concerns.    Gynecologic History Patient's last menstrual period was 08/05/2022. Contraception: none Last Pap: 2019. Result was normal with negative HPV Last Mammogram: Oct. 2023- care every where .  Result was normal  Obstetric History OB History  Gravida Para Term Preterm AB Living  2 0 0   1 1  SAB IAB Ectopic Multiple Live Births  1 0          # Outcome Date GA Lbr Len/2nd Weight Sex Delivery Anes PTL Lv  2 Gravida           1 SAB             Past Medical History:  Diagnosis Date   Depression    Endometriosis 1/12   Laps   Herpes    Takes Valtrex daily; no history of outbreak   History of chlamydia    Infertility, female    Reflux     Past Surgical History:  Procedure Laterality Date   CHOLECYSTECTOMY  2007   LAPAROSCOPY  1/12   Rt ov cyst and endometriosis    Current Outpatient Medications on File Prior to Visit  Medication Sig Dispense Refill   acetaminophen (TYLENOL) 500 MG tablet Take 500 mg by mouth every 6 (six) hours as needed.     busPIRone (BUSPAR) 10 MG tablet TAKE 0.5 TO 2 TABLETS BY MOUTH 3 TIMES A DAY AS NEEDED     escitalopram (LEXAPRO) 5 MG tablet Take 5 mg by mouth daily.     zolpidem (AMBIEN) 10 MG tablet Take 10 mg by mouth at bedtime as needed for sleep.     amLODipine (NORVASC) 2.5 MG tablet Take 1 tablet (2.5 mg total) by mouth daily. 30 tablet 1   valACYclovir (VALTREX) 1000 MG tablet Take 1 tablet (1,000 mg total) by mouth daily. (Patient not taking: Reported on 08/30/2022) 30 tablet 1   [DISCONTINUED] desogestrel-ethinyl estradiol (APRI,EMOQUETTE,SOLIA) 0.15-30 MG-MCG tablet Take 1 tablet by mouth daily.       No current facility-administered  medications on file prior to visit.    Allergies  Allergen Reactions   Iodinated Contrast Media Other (See Comments)    "Passes out with contrast"    Social History:  reports that she has quit smoking. Her smoking use included cigarettes. She has a 0.25 pack-year smoking history. She has never used smokeless tobacco. She reports that she does not currently use alcohol after a past usage of about 1.0 standard drink of alcohol per week. She reports that she does not use drugs.  Family History  Problem Relation Age of Onset   Cancer Father        kidney and lung   Kidney disease Mother    Heart disease Mother    Hypertension Mother    Alcohol abuse Mother        recovered   Heart disease Maternal Grandmother    Heart disease Maternal Grandfather     The following portions of the patient's history were reviewed and updated as appropriate: allergies, current medications, past family history, past medical history, past social history, past surgical history and problem list.  Review of Systems Pertinent items noted in HPI and remainder of comprehensive ROS otherwise  negative.  Physical Exam:  BP 131/86   Pulse 80   Resp 16   Ht 5\' 3"  (1.6 m)   Wt 150 lb (68 kg)   LMP 08/05/2022   BMI 26.57 kg/m  CONSTITUTIONAL: Well-developed, well-nourished female in no acute distress.  HENT:  Normocephalic, atraumatic, External right and left ear normal.  EYES: Conjunctivae and EOM are normal. Pupils are equal, round, and reactive to light. No scleral icterus.  NECK: Normal range of motion, supple, no masses.  Normal thyroid.  SKIN: Skin is warm and dry. No rash noted. Not diaphoretic. No erythema. No pallor. MUSCULOSKELETAL: Normal range of motion. No tenderness.  No cyanosis, clubbing, or edema. NEUROLOGIC: Alert and oriented to person, place, and time. Normal reflexes, muscle tone coordination.  PSYCHIATRIC: Normal mood and affect. Normal behavior. Normal judgment and thought  content. CARDIOVASCULAR: Normal heart rate noted, regular rhythm RESPIRATORY: Clear to auscultation bilaterally. Effort and breath sounds normal, no problems with respiration noted. BREASTS: Symmetric in size. No masses, tenderness, skin changes, nipple drainage, or lymphadenopathy bilaterally. Performed in the presence of a chaperone. ABDOMEN: Soft, no distention noted.  No tenderness, rebound or guarding.  PELVIC: Normal appearing external genitalia and urethral meatus; normal appearing vaginal mucosa and cervix.  No abnormal vaginal discharge noted.  Pap smear obtained.  Normal uterine size, no other palpable masses, no uterine or adnexal tenderness.  Performed in the presence of a chaperone.   Assessment and Plan:  1. Well woman exam with routine gynecological exam  - Cytology - PAP( [place]) - MM Digital Screening; Future    Will follow up results of pap smear and manage accordingly. Mammogram scheduled for 2024  Routine preventative health maintenance measures emphasized. Please refer to After Visit Summary for other counseling recommendations.   Product/process development scientist for [employer], [place]

## 2022-09-01 LAB — CYTOLOGY - PAP
Comment: NEGATIVE
Diagnosis: NEGATIVE
High risk HPV: NEGATIVE

## 2023-09-05 ENCOUNTER — Ambulatory Visit: Payer: BC Managed Care – PPO | Admitting: Obstetrics and Gynecology

## 2023-09-12 ENCOUNTER — Ambulatory Visit (INDEPENDENT_AMBULATORY_CARE_PROVIDER_SITE_OTHER): Payer: BC Managed Care – PPO | Admitting: Obstetrics and Gynecology

## 2023-09-12 ENCOUNTER — Encounter: Payer: Self-pay | Admitting: Obstetrics and Gynecology

## 2023-09-12 VITALS — BP 128/88 | HR 67 | Ht 63.0 in | Wt 150.0 lb

## 2023-09-12 DIAGNOSIS — Z01419 Encounter for gynecological examination (general) (routine) without abnormal findings: Secondary | ICD-10-CM

## 2023-09-12 NOTE — Progress Notes (Signed)
GYNECOLOGY ANNUAL PREVENTATIVE CARE ENCOUNTER NOTE  History:     Maria [name] is a 43 y.o. G33P0011 female here for a routine annual gynecologic exam.  Current complaints: None.   Denies abnormal vaginal bleeding, discharge, pelvic pain, problems with intercourse or other gynecologic concerns.   No breast CA history.  Had one abnormal period a few months ago where she had two menstrual cycles in one month. No problems since.   Gynecologic History Patient's last menstrual period was 09/02/2023. Contraception: none Last Pap: 08/30/22. Result was normal with negative HPV Last Mammogram: 04/13/22.  Result was normal   Obstetric History OB History  Gravida Para Term Preterm AB Living  2 0 0  1 1  SAB IAB Ectopic Multiple Live Births  1 0       # Outcome Date GA Lbr Len/2nd Weight Sex Type Anes PTL Lv  2 Gravida           1 SAB             Past Medical History:  Diagnosis Date   Depression    Endometriosis 1/12   Laps   Herpes    Takes Valtrex daily; no history of outbreak   History of chlamydia    Infertility, female    Reflux     Past Surgical History:  Procedure Laterality Date   CHOLECYSTECTOMY  2007   LAPAROSCOPY  1/12   Rt ov cyst and endometriosis    Current Outpatient Medications on File Prior to Visit  Medication Sig Dispense Refill   busPIRone (BUSPAR) 10 MG tablet TAKE 0.5 TO 2 TABLETS BY MOUTH 3 TIMES A DAY AS NEEDED     escitalopram (LEXAPRO) 5 MG tablet Take 5 mg by mouth daily.     topiramate (TOPAMAX) 25 MG tablet Take 25 mg by mouth at bedtime.     valACYclovir (VALTREX) 1000 MG tablet Take 1 tablet (1,000 mg total) by mouth daily. 30 tablet 1   zolpidem (AMBIEN) 10 MG tablet Take 10 mg by mouth at bedtime as needed for sleep.     acetaminophen (TYLENOL) 500 MG tablet Take 500 mg by mouth every 6 (six) hours as needed. (Patient not taking: Reported on 09/12/2023)     amLODipine (NORVASC) 2.5 MG tablet Take 1 tablet (2.5 mg total) by mouth daily.  (Patient not taking: Reported on 09/12/2023) 30 tablet 1   [DISCONTINUED] desogestrel-ethinyl estradiol (APRI,EMOQUETTE,SOLIA) 0.15-30 MG-MCG tablet Take 1 tablet by mouth daily.       No current facility-administered medications on file prior to visit.    Allergies  Allergen Reactions   Iodinated Contrast Media Other (See Comments)    "Passes out with contrast"    Social History:  reports that she has quit smoking. Her smoking use included cigarettes. She has a 0.3 pack-year smoking history. She has never used smokeless tobacco. She reports that she does not currently use alcohol after a past usage of about 1.0 standard drink of alcohol per week. She reports that she does not use drugs.  Family History  Problem Relation Age of Onset   Cancer Father        kidney and lung   Kidney disease Mother    Heart disease Mother    Hypertension Mother    Alcohol abuse Mother        recovered   Heart disease Maternal Grandmother    Heart disease Maternal Grandfather     The following portions of the patient's  history were reviewed and updated as appropriate: allergies, current medications, past family history, past medical history, past social history, past surgical history and problem list.  Review of Systems Pertinent items noted in HPI and remainder of comprehensive ROS otherwise negative.  Physical Exam:  BP 128/88   Pulse 67   Ht 5\' 3"  (1.6 m)   Wt 150 lb (68 kg)   LMP 09/02/2023   BMI 26.57 kg/m  CONSTITUTIONAL: Well-developed, well-nourished female in no acute distress.  HENT:  Normocephalic, atraumatic, External right and left ear normal.  EYES: Conjunctivae and EOM are normal. Pupils are equal, round, and reactive to light. No scleral icterus.  NECK: Normal range of motion, supple, no masses.  Normal thyroid.  SKIN: Skin is warm and dry. No rash noted. Not diaphoretic. No erythema. No pallor. MUSCULOSKELETAL: Normal range of motion. No tenderness.  No cyanosis, clubbing, or  edema. NEUROLOGIC: Alert and oriented to person, place, and time. Normal reflexes, muscle tone coordination.  PSYCHIATRIC: Normal mood and affect. Normal behavior. Normal judgment and thought content. CARDIOVASCULAR: Normal heart rate noted, regular rhythm RESPIRATORY: Clear to auscultation bilaterally. Effort and breath sounds normal, no problems with respiration noted. BREASTS: Symmetric in size. No masses, tenderness, skin changes, nipple drainage, or lymphadenopathy bilaterally. Performed in the presence of a chaperone. ABDOMEN: Soft, no distention noted.  No tenderness, rebound or guarding.  PELVIC: Normal appearing external genitalia and urethral meatus.  Normal uterine size, no other palpable masses, no uterine or adnexal tenderness.  Performed in the presence of a chaperone.   Assessment and Plan:   1. Women's annual routine gynecological examination (Primary)  - Pap not due - MM Digital Screening; Future  - Declines STD testing.    Mammogram scheduled for breast cancer screening. Routine preventative health maintenance measures emphasized. Please refer to After Visit Summary for other counseling recommendations.   Krzysztof Reichelt, Harolyn Rutherford, NP Faculty Practice Center for [employer], [facility]

## 2023-11-01 ENCOUNTER — Ambulatory Visit: Payer: BC Managed Care – PPO

## 2023-11-01 DIAGNOSIS — Z1231 Encounter for screening mammogram for malignant neoplasm of breast: Secondary | ICD-10-CM | POA: Diagnosis not present

## 2023-11-01 DIAGNOSIS — Z01419 Encounter for gynecological examination (general) (routine) without abnormal findings: Secondary | ICD-10-CM
